# Patient Record
Sex: Female | Born: 1955 | Race: White | Hispanic: No | Marital: Married | Smoking: Never smoker
Health system: Southern US, Community
[De-identification: ages and names within clinical notes are randomized; demographics above are authoritative.]

## PROBLEM LIST (undated history)

## (undated) HISTORY — PX: OTHER SURGICAL HISTORY: SHX169

## (undated) HISTORY — PX: COLONOSCOPY: SHX174

## (undated) HISTORY — PX: TONSILECTOMY, ADENOIDECTOMY, BILATERAL MYRINGOTOMY AND TUBES: SHX2538

---

## 2000-09-15 ENCOUNTER — Encounter: Admission: RE | Admit: 2000-09-15 | Discharge: 2000-09-15 | Payer: Self-pay | Admitting: Obstetrics and Gynecology

## 2000-09-15 ENCOUNTER — Encounter: Payer: Self-pay | Admitting: Obstetrics and Gynecology

## 2001-10-22 ENCOUNTER — Encounter: Payer: Self-pay | Admitting: Obstetrics and Gynecology

## 2001-10-22 ENCOUNTER — Encounter: Admission: RE | Admit: 2001-10-22 | Discharge: 2001-10-22 | Payer: Self-pay | Admitting: Obstetrics and Gynecology

## 2002-11-03 ENCOUNTER — Encounter: Admission: RE | Admit: 2002-11-03 | Discharge: 2002-11-03 | Payer: Self-pay | Admitting: Obstetrics and Gynecology

## 2002-11-03 ENCOUNTER — Encounter: Payer: Self-pay | Admitting: Obstetrics and Gynecology

## 2004-01-08 ENCOUNTER — Encounter: Admission: RE | Admit: 2004-01-08 | Discharge: 2004-01-08 | Payer: Self-pay | Admitting: Obstetrics and Gynecology

## 2005-01-15 ENCOUNTER — Encounter: Admission: RE | Admit: 2005-01-15 | Discharge: 2005-01-15 | Payer: Self-pay | Admitting: Obstetrics and Gynecology

## 2006-01-16 ENCOUNTER — Encounter: Admission: RE | Admit: 2006-01-16 | Discharge: 2006-01-16 | Payer: Self-pay | Admitting: Obstetrics and Gynecology

## 2006-09-10 ENCOUNTER — Ambulatory Visit: Payer: Self-pay | Admitting: Gastroenterology

## 2006-10-05 ENCOUNTER — Ambulatory Visit: Payer: Self-pay | Admitting: Internal Medicine

## 2007-01-18 ENCOUNTER — Encounter: Admission: RE | Admit: 2007-01-18 | Discharge: 2007-01-18 | Payer: Self-pay | Admitting: Obstetrics and Gynecology

## 2008-01-19 ENCOUNTER — Encounter: Admission: RE | Admit: 2008-01-19 | Discharge: 2008-01-19 | Payer: Self-pay | Admitting: Obstetrics and Gynecology

## 2009-01-19 ENCOUNTER — Encounter: Admission: RE | Admit: 2009-01-19 | Discharge: 2009-01-19 | Payer: Self-pay | Admitting: Obstetrics and Gynecology

## 2010-03-06 ENCOUNTER — Encounter: Admission: RE | Admit: 2010-03-06 | Discharge: 2010-03-06 | Payer: Self-pay | Admitting: Obstetrics and Gynecology

## 2011-01-27 ENCOUNTER — Other Ambulatory Visit: Payer: Self-pay | Admitting: Family Medicine

## 2011-01-27 DIAGNOSIS — Z1231 Encounter for screening mammogram for malignant neoplasm of breast: Secondary | ICD-10-CM

## 2011-03-10 ENCOUNTER — Ambulatory Visit
Admission: RE | Admit: 2011-03-10 | Discharge: 2011-03-10 | Disposition: A | Discharge status: Home / Self Care | Payer: BC Managed Care – PPO | Source: Ambulatory Visit | Attending: Family Medicine | Admitting: Family Medicine

## 2011-03-10 DIAGNOSIS — Z1231 Encounter for screening mammogram for malignant neoplasm of breast: Secondary | ICD-10-CM

## 2012-01-30 ENCOUNTER — Other Ambulatory Visit: Payer: Self-pay | Admitting: Family Medicine

## 2012-01-30 DIAGNOSIS — Z1231 Encounter for screening mammogram for malignant neoplasm of breast: Secondary | ICD-10-CM

## 2012-03-11 ENCOUNTER — Ambulatory Visit
Admission: RE | Admit: 2012-03-11 | Discharge: 2012-03-11 | Disposition: A | Discharge status: Home / Self Care | Payer: BC Managed Care – PPO | Source: Ambulatory Visit | Attending: Family Medicine | Admitting: Family Medicine

## 2012-03-11 DIAGNOSIS — Z1231 Encounter for screening mammogram for malignant neoplasm of breast: Secondary | ICD-10-CM

## 2013-02-14 ENCOUNTER — Other Ambulatory Visit: Payer: Self-pay

## 2013-02-14 DIAGNOSIS — Z1231 Encounter for screening mammogram for malignant neoplasm of breast: Secondary | ICD-10-CM

## 2013-03-22 ENCOUNTER — Ambulatory Visit
Admission: RE | Admit: 2013-03-22 | Discharge: 2013-03-22 | Disposition: A | Discharge status: Home / Self Care | Payer: BC Managed Care – PPO | Source: Ambulatory Visit

## 2013-03-22 DIAGNOSIS — Z1231 Encounter for screening mammogram for malignant neoplasm of breast: Secondary | ICD-10-CM

## 2013-08-11 ENCOUNTER — Ambulatory Visit (INDEPENDENT_AMBULATORY_CARE_PROVIDER_SITE_OTHER): Payer: BC Managed Care – PPO | Admitting: *Deleted

## 2013-08-11 DIAGNOSIS — Z23 Encounter for immunization: Secondary | ICD-10-CM

## 2013-08-11 NOTE — Patient Instructions (Signed)

## 2013-08-26 ENCOUNTER — Ambulatory Visit (INDEPENDENT_AMBULATORY_CARE_PROVIDER_SITE_OTHER): Payer: BC Managed Care – PPO | Admitting: *Deleted

## 2013-08-26 DIAGNOSIS — Z23 Encounter for immunization: Secondary | ICD-10-CM

## 2013-09-15 ENCOUNTER — Encounter: Payer: Self-pay | Admitting: *Deleted

## 2013-09-29 ENCOUNTER — Ambulatory Visit (INDEPENDENT_AMBULATORY_CARE_PROVIDER_SITE_OTHER): Payer: BC Managed Care – PPO | Admitting: General Practice

## 2013-09-29 ENCOUNTER — Encounter: Payer: Self-pay | Admitting: General Practice

## 2013-09-29 VITALS — BP 157/83 | HR 81 | Temp 97.8°F | Ht 62.5 in | Wt 149.2 lb

## 2013-09-29 DIAGNOSIS — Z23 Encounter for immunization: Secondary | ICD-10-CM

## 2013-09-29 DIAGNOSIS — Z0000 Encounter for general adult medical examination without abnormal findings: Secondary | ICD-10-CM

## 2013-09-29 NOTE — Patient Instructions (Signed)

## 2013-09-29 NOTE — Progress Notes (Signed)
   Subjective:    Patient ID: Julie Benton, female    DOB: July 16, 1955, 58 y.o.   MRN: [id number]  HPI Patient presents today for annual physical. Denies any chronic health conditions. Taking OTC vitamins. Annual gyn exam performed by provider in [place]. Current also with mammograms. Family history of diabetes and hypertension. Reports eating healthy and regular exercise.    Review of Systems  Constitutional: Negative for fever and chills.  Respiratory: Negative for chest tightness and shortness of breath.   Cardiovascular: Negative for chest pain and palpitations.  Gastrointestinal: Negative for nausea, vomiting, abdominal pain, diarrhea, constipation and blood in stool.  Neurological: Negative for dizziness, weakness and headaches.       Objective:   Physical Exam  Constitutional: She is oriented to person, place, and time. She appears well-developed and well-nourished.  HENT:  Head: Normocephalic and atraumatic.  Right Ear: External ear normal.  Left Ear: External ear normal.  Mouth/Throat: Oropharynx is clear and moist.  Eyes: EOM are normal. Pupils are equal, round, and reactive to light.  Neck: Normal range of motion. Neck supple. No thyromegaly present.  Cardiovascular: Normal rate, regular rhythm and normal heart sounds.   Pulmonary/Chest: Effort normal and breath sounds normal. No respiratory distress. She exhibits no tenderness.  Abdominal: Soft. Bowel sounds are normal. She exhibits no distension. There is no tenderness.  Lymphadenopathy:    She has no cervical adenopathy.  Neurological: She is alert and oriented to person, place, and time.  Skin: Skin is warm and dry.  Psychiatric: She has a normal mood and affect.          Assessment & Plan:  1. Need for pneumococcal vaccine  - Pneumococcal conjugate vaccine 13-valent  2. Annual physical exam -discussed benefits of healthy eating and regular exercise -RTO prn -Patient verbalized understanding Coralie KeensMae E.  Obera Stauch, FNP-C

## 2013-10-04 ENCOUNTER — Ambulatory Visit: Payer: BC Managed Care – PPO | Admitting: General Practice

## 2013-10-11 ENCOUNTER — Ambulatory Visit: Payer: BC Managed Care – PPO | Admitting: General Practice

## 2013-12-21 ENCOUNTER — Ambulatory Visit (INDEPENDENT_AMBULATORY_CARE_PROVIDER_SITE_OTHER): Payer: BC Managed Care – PPO | Admitting: Family Medicine

## 2013-12-21 VITALS — BP 135/85 | HR 92 | Temp 98.5°F | Ht 62.5 in

## 2013-12-21 DIAGNOSIS — T148XXA Other injury of unspecified body region, initial encounter: Secondary | ICD-10-CM

## 2013-12-21 DIAGNOSIS — IMO0002 Reserved for concepts with insufficient information to code with codable children: Secondary | ICD-10-CM

## 2013-12-21 NOTE — Progress Notes (Signed)
   Subjective:    Patient ID: Julie Benton, female    DOB: 05-03-1956, 58 y.o.   MRN: [id number]  HPI Patient cut her right index finger by accident this am and she is up to date with TD.   Review of Systems C/o laceration right index finger No chest pain, SOB, HA, dizziness, vision change, N/V, diarrhea, constipation, dysuria, urinary urgency or frequency, myalgias, arthralgias or rash.     Objective:   Physical Exam  2 cm laceration right index finger.  Laceration cleaned with betadine and irrigated with sterile NS.  Marcaine used for local anesthetic and then once adequate anesthesia four # 5.0 nylon sutures are Used are placed to approximate incision and then the index finger is dressed.        Assessment & Plan:  Laceration right index finger - Discussed signs and symptoms of infection.  Discussed taking motrin and tylenol otc prn for pain.  Laceration approximated with 5.0 nylon sutures.  Patient given instructions to follow up in one week for sutrue removal.  Deatra CanterWilliam J Marcene Laskowski FNP

## 2013-12-29 ENCOUNTER — Encounter: Payer: Self-pay | Admitting: Family Medicine

## 2013-12-29 ENCOUNTER — Ambulatory Visit (INDEPENDENT_AMBULATORY_CARE_PROVIDER_SITE_OTHER): Payer: BC Managed Care – PPO | Admitting: Family Medicine

## 2013-12-29 VITALS — BP 128/77 | HR 87 | Temp 97.1°F | Ht 62.5 in | Wt 150.4 lb

## 2013-12-29 DIAGNOSIS — Z4802 Encounter for removal of sutures: Secondary | ICD-10-CM

## 2013-12-29 NOTE — Progress Notes (Signed)
   Subjective:    Patient ID: Julie ParodySherri S Mira, female    DOB: 10-07-1955, 58 y.o.   MRN: [id number]  HPI Suture removal right index finger.  Denies infection.   Review of Systems    No chest pain, SOB, HA, dizziness, vision change, N/V, diarrhea, constipation, dysuria, urinary urgency or frequency, myalgias, arthralgias or rash.  Objective:   Physical Exam   4 nylon sutures removed right index finger and edges of Lac well approx     Assessment & Plan:  Suture removal - Follow up prn

## 2014-02-15 ENCOUNTER — Other Ambulatory Visit: Payer: Self-pay

## 2014-02-15 DIAGNOSIS — Z1231 Encounter for screening mammogram for malignant neoplasm of breast: Secondary | ICD-10-CM

## 2014-02-28 ENCOUNTER — Ambulatory Visit (INDEPENDENT_AMBULATORY_CARE_PROVIDER_SITE_OTHER): Payer: BC Managed Care – PPO | Admitting: Family

## 2014-02-28 ENCOUNTER — Ambulatory Visit (INDEPENDENT_AMBULATORY_CARE_PROVIDER_SITE_OTHER): Payer: BC Managed Care – PPO

## 2014-02-28 VITALS — BP 126/81 | HR 82 | Temp 97.3°F

## 2014-02-28 DIAGNOSIS — S99929A Unspecified injury of unspecified foot, initial encounter: Secondary | ICD-10-CM

## 2014-02-28 DIAGNOSIS — S8990XA Unspecified injury of unspecified lower leg, initial encounter: Secondary | ICD-10-CM

## 2014-02-28 DIAGNOSIS — S92901A Unspecified fracture of right foot, initial encounter for closed fracture: Secondary | ICD-10-CM

## 2014-02-28 DIAGNOSIS — S92909A Unspecified fracture of unspecified foot, initial encounter for closed fracture: Secondary | ICD-10-CM

## 2014-02-28 DIAGNOSIS — S99922A Unspecified injury of left foot, initial encounter: Secondary | ICD-10-CM

## 2014-02-28 DIAGNOSIS — S99919A Unspecified injury of unspecified ankle, initial encounter: Secondary | ICD-10-CM

## 2014-02-28 DIAGNOSIS — S99921A Unspecified injury of right foot, initial encounter: Secondary | ICD-10-CM

## 2014-02-28 NOTE — Progress Notes (Signed)
   Subjective:    Patient ID: Julie Benton, female    DOB: Nov 02, 1955, 58 y.o.   MRN: [id number]  Foot Injury  The incident occurred 2 days ago. The incident occurred in the yard. The injury mechanism was a twisting injury. The pain is present in the right foot. The quality of the pain is described as aching and shooting. The pain is at a severity of 1/10. The pain is mild. The pain has been intermittent since onset. Associated symptoms include an inability to bear weight and a loss of motion. Pertinent negatives include no muscle weakness, numbness or tingling. She reports no foreign bodies present. The symptoms are aggravated by movement and weight bearing. She has tried ice and NSAIDs for the symptoms. The treatment provided mild relief.      Review of Systems  Constitutional: Negative.   HENT: Negative.   Eyes: Negative.   Respiratory: Negative.  Negative for shortness of breath.   Cardiovascular: Negative.  Negative for palpitations.  Gastrointestinal: Negative.   Endocrine: Negative.   Genitourinary: Negative.   Musculoskeletal: Negative.   Neurological: Negative.  Negative for tingling, numbness and headaches.  Hematological: Negative.   Psychiatric/Behavioral: Negative.   All other systems reviewed and are negative.      Objective:   Physical Exam  Vitals reviewed. Constitutional: She is oriented to person, place, and time. She appears well-developed and well-nourished. No distress.  Eyes: Pupils are equal, round, and reactive to light.  Neck: Normal range of motion. Neck supple. No thyromegaly present.  Cardiovascular: Normal rate, regular rhythm, normal heart sounds and intact distal pulses.   No murmur heard. Pulmonary/Chest: Effort normal and breath sounds normal. No respiratory distress. She has no wheezes.  Abdominal: Soft. Bowel sounds are normal. She exhibits no distension. There is no tenderness.  Musculoskeletal: Normal range of motion. She exhibits edema and  tenderness.  Right foot swollen and tender to touch with ecchymosis   Neurological: She is alert and oriented to person, place, and time. She has normal reflexes. No cranial nerve deficit.  Skin: Skin is warm and dry.  Psychiatric: She has a normal mood and affect. Her behavior is normal. Judgment and thought content normal.     BP 126/81  Pulse 82  Temp(Src) 97.3 F (36.3 C) (Oral)      Assessment & Plan:  1. Foot injury, right, initial encounter - DG Foot Complete Right; Future - Ambulatory referral to Orthopedic Surgery  2. Foot fracture, right, closed, initial encounter -Rest -Ice -Orthopedic referral  Jannifer Rodney, FNP

## 2014-02-28 NOTE — Patient Instructions (Signed)
Metatarsal Stress Fracture When too much stress is put on the foot, as in running and jumping sports, the center shaft of the bones of the forefoot is very susceptible to stress fractures (break in bone). This is because of repetitive stress on the bone. This injury is more common if osteoporosis is present or if inadequate running shoes are used. Rapid increase in running distances are often the cause. Running distances should be gradually increased to avoid this problem. Shoes should be used which adequately cushion the foot. Shoes should absorb the shocks of the activity.  DIAGNOSIS  Usually the diagnosis is made by history. The foot progressively becomes sorer with activities. X-rays may be negative (show no break) within the first 2 to 3 weeks of the beginning of pain. A later X-ray may show signs of healing bone (callus formation). A bone scan or MRI will usually make the diagnosis earlier. TREATMENT AND HOME CARE INSTRUCTIONS  Treatment may or may not include a cast, removable fracture boot, or walking shoe. Casts are used for short periods of time to prevent muscle atrophy (muscle wasting).  Activities should be stopped until further advised by your caregiver.  Wear shoes with adequate shock absorbing abilities.  Alternative exercise may be undertaken while waiting for healing. These may include bicycling and swimming, or as your caregiver suggests. If you do not have a cast or splint:  You may walk on your injured foot as tolerated or advised.  Do not put any weight on your injured foot for as long as directed by your caregiver. Slowly increase the amount of time you walk on the foot as the pain allows or as advised.  Use crutches until you can bear weight without pain. A gradual increase in weight bearing may help.  Apply ice to the injury for 15-20 minutes each hour while awake for the first 2 days. Put the ice in a plastic bag and place a towel between the bag of ice and your  skin.  Only take over-the-counter or prescription medicines for pain, discomfort, or fever as directed by your caregiver. SEEK IMMEDIATE MEDICAL CARE IF:   Pain is becoming worse rather than better, or if pain is uncontrolled with medications.  You have increased swelling or redness in the foot. MAKE SURE YOU:   Understand these instructions.  Will watch your condition.  Will get help right away if you are not doing well or get worse. Document Released: 05/23/2000 Document Revised: 08/18/2011 Document Reviewed: 03/21/2008 Exit[employer] Patient Information 2015 [employer]. This information is not intended to replace advice given to you by your health care provider. Make sure you discuss any questions you have with your health care provider.  

## 2014-03-24 ENCOUNTER — Other Ambulatory Visit: Payer: Self-pay

## 2014-03-28 ENCOUNTER — Ambulatory Visit
Admission: RE | Admit: 2014-03-28 | Discharge: 2014-03-28 | Disposition: A | Discharge status: Home / Self Care | Payer: BC Managed Care – PPO | Source: Ambulatory Visit

## 2014-03-28 DIAGNOSIS — Z1231 Encounter for screening mammogram for malignant neoplasm of breast: Secondary | ICD-10-CM

## 2015-02-23 ENCOUNTER — Other Ambulatory Visit: Payer: Self-pay

## 2015-02-23 DIAGNOSIS — Z1231 Encounter for screening mammogram for malignant neoplasm of breast: Secondary | ICD-10-CM

## 2015-04-18 ENCOUNTER — Ambulatory Visit
Admission: RE | Admit: 2015-04-18 | Discharge: 2015-04-18 | Disposition: A | Discharge status: Home / Self Care | Payer: BC Managed Care – PPO | Source: Ambulatory Visit

## 2015-04-18 DIAGNOSIS — Z1231 Encounter for screening mammogram for malignant neoplasm of breast: Secondary | ICD-10-CM

## 2015-06-06 ENCOUNTER — Ambulatory Visit (INDEPENDENT_AMBULATORY_CARE_PROVIDER_SITE_OTHER): Payer: BC Managed Care – PPO | Admitting: Physician Assistant

## 2015-06-06 ENCOUNTER — Encounter: Payer: Self-pay | Admitting: Physician Assistant

## 2015-06-06 VITALS — BP 142/84 | HR 95 | Temp 97.7°F | Ht 62.5 in | Wt 147.4 lb

## 2015-06-06 DIAGNOSIS — J029 Acute pharyngitis, unspecified: Secondary | ICD-10-CM

## 2015-06-06 DIAGNOSIS — J0100 Acute maxillary sinusitis, unspecified: Secondary | ICD-10-CM | POA: Diagnosis not present

## 2015-06-06 LAB — POCT RAPID STREP A (OFFICE): Rapid Strep A Screen: NEGATIVE

## 2015-06-06 MED ORDER — SULFAMETHOXAZOLE-TRIMETHOPRIM 800-160 MG PO TABS: 1.0000 | ORAL_TABLET | Freq: Two times a day (BID) | ORAL | Status: DC

## 2015-06-06 NOTE — Patient Instructions (Signed)

## 2015-06-06 NOTE — Progress Notes (Signed)
Subjective:     Patient ID: Julie Benton, female   DOB: 1955-06-15, 59 y.o.   MRN: [id number]  HPI Pt with facial pain and sinus congestion for 3-4 days Using Allegra D and Mucinex but sx continue  Review of Systems  Constitutional: Positive for fever and fatigue. Negative for chills, activity change and appetite change.  HENT: Positive for congestion, dental problem, postnasal drip, sinus pressure and sore throat. Negative for ear pain and rhinorrhea.   Respiratory: Positive for cough. Negative for chest tightness, shortness of breath and wheezing.   Cardiovascular: Negative.        Objective:   Physical Exam  Constitutional: She appears well-developed and well-nourished.  HENT:  Right Ear: External ear normal.  Left Ear: External ear normal.  Nose: Nose normal.  Mouth/Throat: Oropharynx is clear and moist. No oropharyngeal exudate.  + maxillary sinus TTP  Neck: Neck supple.  Cardiovascular: Normal rate, regular rhythm and normal heart sounds.   Pulmonary/Chest: Effort normal and breath sounds normal. No respiratory distress. She has no wheezes. She has no rales. She exhibits no tenderness.  Lymphadenopathy:    She has no cervical adenopathy.  Nursing note and vitals reviewed.  RST- neg    Assessment:     Sinusitis    Plan:     Bactrim DS x 10 d due to Keflex allergy Continue the Allergra D Fluids Rest F/U prn

## 2015-07-02 ENCOUNTER — Ambulatory Visit (INDEPENDENT_AMBULATORY_CARE_PROVIDER_SITE_OTHER): Payer: BC Managed Care – PPO | Admitting: Pediatrics

## 2015-07-02 ENCOUNTER — Encounter: Payer: Self-pay | Admitting: Pediatrics

## 2015-07-02 VITALS — BP 128/86 | HR 90 | Temp 97.4°F | Ht 62.5 in | Wt 146.4 lb

## 2015-07-02 DIAGNOSIS — J329 Chronic sinusitis, unspecified: Secondary | ICD-10-CM | POA: Diagnosis not present

## 2015-07-02 MED ORDER — DOXYCYCLINE HYCLATE 100 MG PO TABS: 100.0000 mg | ORAL_TABLET | Freq: Two times a day (BID) | ORAL | Status: DC

## 2015-07-02 NOTE — Progress Notes (Signed)
Subjective:    Patient ID: Julie Benton, female    DOB: 10-07-1955, 60 y.o.   MRN: [id number]  CC: Headache and Dental Pain   HPI: Julie Benton is a 60 y.o. female presenting for Headache and Dental Pain  Took bactrim 4 weeks ago for acute sinusitis, helped some within 3 days Has been using saline solution daily at least twice a day Taking mucinex and allegra D Pain in L side of face that is severe No fevers Appetite down Dental pain, not able to chew on L side Went to dentist when had same pain with infection 4 weeks ago, was told her teeth are fine   Depression screen [facility] 2/9 07/02/2015 06/06/2015  Decreased Interest 0 0  Down, Depressed, Hopeless 0 0  PHQ - 2 Score 0 0     Relevant past medical, surgical, family and social history reviewed and updated as indicated. Interim medical history since our last visit reviewed. Allergies and medications reviewed and updated.    ROS: Per HPI unless specifically indicated above  History  Smoking status  . Never Smoker   Smokeless tobacco  . Not on file    Past Medical History There are no active problems to display for this patient.   Current Outpatient Prescriptions  Medication Sig Dispense Refill  . Biotin 5000 MCG TABS Take by mouth.    . Calcium-Phosphorus-Vitamin D (CITRACAL +D3 PO) Take by mouth.    . Chromium-Cinnamon (CINNAMON PLUS CHROMIUM) [phone] MCG-MG CAPS Take by mouth.    Marland Kitchen CINNAMON PO Take by mouth.    . diphenhydrAMINE (BENADRYL) 25 mg capsule Take 25 mg by mouth every 6 (six) hours as needed.    . Magnesium 250 MG TABS Take by mouth.    . Milk Thistle 1000 MG CAPS Take by mouth.    . Omega-3 Fatty Acids (FISH OIL TRIPLE STRENGTH) 1400 MG CAPS Take by mouth.    Gerarda Fraction Root 100 MG CAPS Take by mouth.    . vitamin E 400 UNIT capsule Take 400 Units by mouth daily.    Marland Kitchen doxycycline (VIBRA-TABS) 100 MG tablet Take 1 tablet (100 mg total) by mouth 2 (two) times daily. 20 tablet 0   No  current facility-administered medications for this visit.       Objective:    BP 128/86 mmHg  Pulse 90  Temp(Src) 97.4 F (36.3 C) (Oral)  Ht 5' 2.5" (1.588 m)  Wt 146 lb 6.4 oz (66.407 kg)  BMI 26.33 kg/m2  Wt Readings from Last 3 Encounters:  07/02/15 146 lb 6.4 oz (66.407 kg)  06/06/15 147 lb 6.4 oz (66.86 kg)  12/29/13 150 lb 6.4 oz (68.221 kg)     Gen: NAD, alert, cooperative with exam, NCAT EYES: EOMI, no scleral injection or icterus ENT:  TMs pearly gray R side, obstructed with cerumen on L side, OP without erythema, very tender over L max sinus, slightly tender over L side frontal sinus LYMPH: no cervical LAD CV: NRRR, normal S1/S2, no murmur, distal pulses 2+ b/l Resp: CTABL, no wheezes, normal WOB Abd: soft, NTND Ext: No edema, warm Neuro: Alert and oriented MSK: normal muscle bulk     Assessment & Plan:    Adella was seen today for headache and dental pain.  Diagnoses and all orders for this visit:  Recurrent sinusitis -     doxycycline (VIBRA-TABS) 100 MG tablet; Take 1 tablet (100 mg total) by mouth 2 (two) times daily.  Follow up plan: prn  Rex Kras, MD [facility] 07/02/2015, 9:05 AM

## 2015-08-28 ENCOUNTER — Encounter: Payer: Self-pay | Admitting: Nurse Practitioner

## 2015-08-28 ENCOUNTER — Ambulatory Visit (INDEPENDENT_AMBULATORY_CARE_PROVIDER_SITE_OTHER): Payer: BC Managed Care – PPO | Admitting: Nurse Practitioner

## 2015-08-28 ENCOUNTER — Ambulatory Visit (INDEPENDENT_AMBULATORY_CARE_PROVIDER_SITE_OTHER): Payer: BC Managed Care – PPO

## 2015-08-28 ENCOUNTER — Telehealth: Payer: Self-pay | Admitting: Nurse Practitioner

## 2015-08-28 VITALS — BP 138/86 | HR 87 | Temp 98.3°F | Ht 62.0 in | Wt 146.0 lb

## 2015-08-28 DIAGNOSIS — T1490XA Injury, unspecified, initial encounter: Secondary | ICD-10-CM

## 2015-08-28 DIAGNOSIS — S99912A Unspecified injury of left ankle, initial encounter: Secondary | ICD-10-CM

## 2015-08-28 DIAGNOSIS — M25572 Pain in left ankle and joints of left foot: Secondary | ICD-10-CM

## 2015-08-28 DIAGNOSIS — S8262XA Displaced fracture of lateral malleolus of left fibula, initial encounter for closed fracture: Secondary | ICD-10-CM

## 2015-08-28 NOTE — Patient Instructions (Signed)
Ankle Fracture A fracture is a break in a bone. The ankle joint is made up of three bones. These include the lower (distal)sections of your lower leg bones, called the tibia and fibula, along with a bone in your foot, called the talus. Depending on how bad the break is and if more than one ankle joint bone is broken, a cast or splint is used to protect and keep your injured bone from moving while it heals. Sometimes, surgery is required to help the fracture heal properly.  There are two general types of fractures:  Stable fracture. This includes a single fracture line through one bone, with no injury to ankle ligaments. A fracture of the talus that does not have any displacement (movement of the bone on either side of the fracture line) is also stable.  Unstable fracture. This includes more than one fracture line through one or more bones in the ankle joint. It also includes fractures that have displacement of the bone on either side of the fracture line. CAUSES  A direct blow to the ankle.   Quickly and severely twisting your ankle.  Trauma, such as a car accident or falling from a significant height. RISK FACTORS You may be at a higher risk of ankle fracture if:  You have certain medical conditions.  You are involved in high-impact sports.  You are involved in a high-impact car accident. SIGNS AND SYMPTOMS   Tender and swollen ankle.  Bruising around the injured ankle.  Pain on movement of the ankle.  Difficulty walking or putting weight on the ankle.  A cold foot below the site of the ankle injury. This can occur if the blood vessels passing through your injured ankle were also damaged.  Numbness in the foot below the site of the ankle injury. DIAGNOSIS  An ankle fracture is usually diagnosed with a physical exam and X-rays. A CT scan may also be required for complex fractures. TREATMENT  Stable fractures are treated with a cast or splint and using crutches to avoid putting  weight on your injured ankle. This is followed by an ankle strengthening program. Some patients require a special type of cast, depending on other medical problems they may have. Unstable fractures require surgery to ensure the bones heal properly. Your health care provider will tell you what type of fracture you have and the best treatment for your condition. HOME CARE INSTRUCTIONS   Review correct crutch use with your health care provider and use your crutches as directed. Safe use of crutches is extremely important. Misuse of crutches can cause you to fall or cause injury to nerves in your hands or armpits.  Do not put weight or pressure on the injured ankle until directed by your health care provider.  To lessen the swelling, keep the injured leg elevated while sitting or lying down.  Apply ice to the injured area:  Put ice in a plastic bag.  Place a towel between your cast and the bag.  Leave the ice on for 20 minutes, 2-3 times a day.  If you have a plaster or fiberglass cast:  Do not try to scratch the skin under the cast with any objects. This can increase your risk of skin infection.  Check the skin around the cast every day. You may put lotion on any red or sore areas.  Keep your cast dry and clean.  If you have a plaster splint:  Wear the splint as directed.  You may loosen the elastic   around the splint if your toes become numb, tingle, or turn cold or blue.  Do not put pressure on any part of your cast or splint; it may break. Rest your cast only on a pillow the first 24 hours until it is fully hardened.  Your cast or splint can be protected during bathing with a plastic bag sealed to your skin with medical tape. Do not lower the cast or splint into water.  Take medicines as directed by your health care provider. Only take over-the-counter or prescription medicines for pain, discomfort, or fever as directed by your health care provider.  Do not drive a vehicle until  your health care provider specifically tells you it is safe to do so.  If your health care provider has given you a follow-up appointment, it is very important to keep that appointment. Not keeping the appointment could result in a chronic or permanent injury, pain, and disability. If you have any problem keeping the appointment, call the facility for assistance. SEEK MEDICAL CARE IF: You develop increased swelling or discomfort. SEEK IMMEDIATE MEDICAL CARE IF:   Your cast gets damaged or breaks.  You have continued severe pain.  You develop new pain or swelling after the cast was put on.  Your skin or toenails below the injury turn blue or gray.  Your skin or toenails below the injury feel cold, numb, or have loss of sensitivity to touch.  There is a bad smell or pus draining from under the cast. MAKE SURE YOU:   Understand these instructions.  Will watch your condition.  Will get help right away if you are not doing well or get worse.   This information is not intended to replace advice given to you by your health care provider. Make sure you discuss any questions you have with your health care provider.   Document Released: 05/23/2000 Document Revised: 05/31/2013 Document Reviewed: 12/23/2012 Elsevier Interactive Patient Education 2016 [employer]  

## 2015-08-28 NOTE — Progress Notes (Signed)
   Subjective:    Patient ID: Julie Benton, female    DOB: 02/25/1956, 60 y.o.   MRN: [id number]  HPI Patient in c/o left ankle pain- she was walking this morning and got her foot on something and injured her ankle- swollen and painful to walk on. Sitting pain is 5/10 walking 10/10.    Review of Systems  Constitutional: Negative.   HENT: Negative.   Respiratory: Negative.   Cardiovascular: Negative.   Genitourinary: Negative.   Neurological: Negative.   Psychiatric/Behavioral: Negative.   All other systems reviewed and are negative.      Objective:   Physical Exam  Constitutional: She is oriented to person, place, and time. She appears well-developed and well-nourished.  Cardiovascular: Normal rate, regular rhythm and normal heart sounds.   Pulmonary/Chest: Effort normal and breath sounds normal.  Neurological: She is alert and oriented to person, place, and time.  Skin: Skin is warm.  Psychiatric: She has a normal mood and affect. Her behavior is normal. Judgment and thought content normal.   BP 138/86 mmHg  Pulse 87  Temp(Src) 98.3 F (36.8 C) (Oral)  Ht 5\' 2"  (1.575 m)  Wt 146 lb (66.225 kg)  BMI 26.70 kg/m2  Left ankle- avulsion fracture distal lateral malleolus-Preliminary reading by Paulene FloorMary Haneen Bernales, FNP  [facility]       Assessment & Plan:   1. Injury   2. Ankle fracture, lateral malleolus, closed, left, initial encounter    rx written for short cam walker Elevate when sitting RTO prn  Mary-Margaret Daphine DeutscherMartin, FNP

## 2015-08-30 ENCOUNTER — Telehealth: Payer: Self-pay | Admitting: Nurse Practitioner

## 2015-08-30 NOTE — Telephone Encounter (Signed)
Advised pt there is no follow up appt but if she doesn't get better or the ankle gets worse to CB for appt to be re-evaluated. Pt voiced understanding.

## 2015-09-03 NOTE — Telephone Encounter (Signed)
Brace hurts too bad, [facility] adjusted.

## 2015-11-23 ENCOUNTER — Ambulatory Visit (INDEPENDENT_AMBULATORY_CARE_PROVIDER_SITE_OTHER): Payer: BC Managed Care – PPO | Admitting: Family Medicine

## 2015-11-23 ENCOUNTER — Encounter: Payer: Self-pay | Admitting: Family Medicine

## 2015-11-23 VITALS — BP 136/84 | HR 79 | Temp 97.8°F | Ht 63.0 in | Wt 144.0 lb

## 2015-11-23 DIAGNOSIS — Z23 Encounter for immunization: Secondary | ICD-10-CM | POA: Diagnosis not present

## 2015-11-23 DIAGNOSIS — H6122 Impacted cerumen, left ear: Secondary | ICD-10-CM | POA: Diagnosis not present

## 2015-11-23 DIAGNOSIS — H6092 Unspecified otitis externa, left ear: Secondary | ICD-10-CM

## 2015-11-23 MED ORDER — CIPROFLOXACIN-DEXAMETHASONE 0.3-0.1 % OT SUSP: 4.0000 [drp] | Freq: Two times a day (BID) | OTIC | Status: DC

## 2015-11-23 NOTE — Patient Instructions (Signed)
Pneumococcal Polysaccharide Vaccine: What You Need to Know  1. Why get vaccinated?  Vaccination can protect older adults (and some children and younger adults) from pneumococcal disease.  Pneumococcal disease is caused by bacteria that can spread from person to person through close contact. It can cause ear infections, and it can also lead to more serious infections of the:   · Lungs (pneumonia),  · Blood (bacteremia), and  · Covering of the brain and spinal cord (meningitis). Meningitis can cause deafness and brain damage, and it can be fatal.  Anyone can get pneumococcal disease, but children under 2 years of age, people with certain medical conditions, adults over 65 years of age, and cigarette smokers are at the highest risk.  About 18,000 older adults die each year from pneumococcal disease in the [place].  Treatment of pneumococcal infections with penicillin and other drugs used to be more effective. But some strains of the disease have become resistant to these drugs. This makes prevention of the disease, through vaccination, even more important.  2. Pneumococcal polysaccharide vaccine (PPSV23)  Pneumococcal polysaccharide vaccine (PPSV23) protects against 23 types of pneumococcal bacteria. It will not prevent all pneumococcal disease.  PPSV23 is recommended for:  · All adults 65 years of age and older,  · Anyone 2 through 60 years of age with certain long-term health problems,  · Anyone 2 through 60 years of age with a weakened immune system,  · Adults 19 through 60 years of age who smoke cigarettes or have asthma.  Most people need only one dose of PPSV. A second dose is recommended for certain high-risk groups. People 65 and older should get a dose even if they have gotten one or more doses of the vaccine before they turned 65.  Your healthcare provider can give you more information about these recommendations.  Most healthy adults develop protection within 2 to 3 weeks of getting the shot.  3. Some  people should not get this vaccine  · Anyone who has had a life-threatening allergic reaction to PPSV should not get another dose.  · Anyone who has a severe allergy to any component of PPSV should not receive it. Tell your provider if you have any severe allergies.  · Anyone who is moderately or severely ill when the shot is scheduled may be asked to wait until they recover before getting the vaccine. Someone with a mild illness can usually be vaccinated.  · Children less than 2 years of age should not receive this vaccine.  · There is no evidence that PPSV is harmful to either a pregnant woman or to her fetus. However, as a precaution, women who need the vaccine should be vaccinated before becoming pregnant, if possible.  4. Risks of a vaccine reaction  With any medicine, including vaccines, there is a chance of side effects. These are usually mild and go away on their own, but serious reactions are also possible.  About half of people who get PPSV have mild side effects, such as redness or pain where the shot is given, which go away within about two days.  Less than 1 out of 100 people develop a fever, muscle aches, or more severe local reactions.  Problems that could happen after any vaccine:  · People sometimes faint after a medical procedure, including vaccination. Sitting or lying down for about 15 minutes can help prevent fainting, and injuries caused by a fall. Tell your doctor if you feel dizzy, or have vision changes or   ringing in the ears.  · Some people get severe pain in the shoulder and have difficulty moving the arm where a shot was given. This happens very rarely.  · Any medication can cause a severe allergic reaction. Such reactions from a vaccine are very rare, estimated at about 1 in a million doses, and would happen within a few minutes to a few hours after the vaccination.  As with any medicine, there is a very remote chance of a vaccine causing a serious injury or death.  The safety of  vaccines is always being monitored. For more information, visit: www.cdc.gov/vaccinesafety/  5. What if there is a serious reaction?  What should I look for?  Look for anything that concerns you, such as signs of a severe allergic reaction, very high fever, or unusual behavior.   Signs of a severe allergic reaction can include hives, swelling of the face and throat, difficulty breathing, a fast heartbeat, dizziness, and weakness. These would usually start a few minutes to a few hours after the vaccination.  What should I do?  If you think it is a severe allergic reaction or other emergency that can't wait, call 9-1-1 or get to the nearest hospital. Otherwise, call your doctor.  Afterward, the reaction should be reported to the Vaccine Adverse Event Reporting System (VAERS). Your doctor might file this report, or you can do it yourself through the VAERS web site at www.vaers.hhs.gov, or by calling 1-800-822-7967.   VAERS does not give medical advice.  6. How can I learn more?  · Ask your doctor. He or she can give you the vaccine package insert or suggest other sources of information.  · Call your local or state health department.  · Contact the Centers for Disease Control and Prevention (CDC):    Call 1-800-232-4636 (1-800-CDC-INFO) or    Visit CDC's website at www.cdc.gov/vaccines  CDC Pneumococcal Polysaccharide Vaccine VIS (09/30/13)     This information is not intended to replace advice given to you by your health care provider. Make sure you discuss any questions you have with your health care provider.     Document Released: 03/23/2006 Document Revised: 06/16/2014 Document Reviewed: 10/03/2013  Elsevier Interactive Patient Education ©2016 [employer]

## 2015-11-23 NOTE — Progress Notes (Signed)
BP 136/84 mmHg  Pulse 79  Temp(Src) 97.8 F (36.6 C)  Ht 5\' 3"  (1.6 m)  Wt 144 lb (65.318 kg)  BMI 25.51 kg/m2   Subjective:    Patient ID: Julie Benton, female    DOB: 08-29-1955, 60 y.o.   MRN: [id number]  HPI: Julie Benton is a 60 y.o. female presenting on 11/23/2015 for Ear Pain   HPI Left ear pain and pressure Patient has been having left ear pain and pressure since yesterday. She fell as shooting pain in that left ear started yesterday and then she is also been slightly muffled in her right ear as well. She's been trying the deep rocks drops but is also been using Q-tips as well. She denies any fevers or chills or shortness of breath. She denies any nasal congestion or postnasal drainage.  Relevant past medical, surgical, family and social history reviewed and updated as indicated. Interim medical history since our last visit reviewed. Allergies and medications reviewed and updated.  Review of Systems  Constitutional: Negative for fever and chills.  HENT: Positive for ear pain and hearing loss (Muffled). Negative for congestion and ear discharge.   Eyes: Negative for redness and visual disturbance.  Respiratory: Negative for cough, chest tightness and shortness of breath.   Cardiovascular: Negative for chest pain and leg swelling.  Genitourinary: Negative for dysuria and difficulty urinating.  Musculoskeletal: Negative for back pain and gait problem.  Skin: Negative for color change and rash.  Neurological: Negative for light-headedness and headaches.  Psychiatric/Behavioral: Negative for behavioral problems and agitation.  All other systems reviewed and are negative.   Per HPI unless specifically indicated above     Medication List       This list is accurate as of: 11/23/15  6:50 PM.  Always use your most recent med list.               Biotin 5000 MCG Tabs  Take by mouth.     CINNAMON PLUS CHROMIUM [phone] MCG-MG Caps  Generic drug:   Chromium-Cinnamon  Take by mouth.     CITRACAL +D3 PO  Take by mouth.     diphenhydrAMINE 25 mg capsule  Commonly known as:  BENADRYL  Take 25 mg by mouth every 6 (six) hours as needed.     FISH OIL TRIPLE STRENGTH 1400 MG Caps  Take by mouth.     Magnesium 250 MG Tabs  Take by mouth.     Milk Thistle 1000 MG Caps  Take by mouth.     Valerian Root 100 MG Caps  Take by mouth.     vitamin E 400 UNIT capsule  Take 400 Units by mouth daily.           Objective:    BP 136/84 mmHg  Pulse 79  Temp(Src) 97.8 F (36.6 C)  Ht 5\' 3"  (1.6 m)  Wt 144 lb (65.318 kg)  BMI 25.51 kg/m2  Wt Readings from Last 3 Encounters:  11/23/15 144 lb (65.318 kg)  08/28/15 146 lb (66.225 kg)  07/02/15 146 lb 6.4 oz (66.407 kg)    Physical Exam  Constitutional: She is oriented to person, place, and time. She appears well-developed and well-nourished. No distress.  HENT:  Nose: Nose normal.  Mouth/Throat: Oropharynx is clear and moist. No oropharyngeal exudate.  Patient has wax in her left ear that was unable to be lavaged. Mild erythema and canals well.  Patient also has wax in her right ear but  that was able to be lavaged and ear canal looks normal.  Eyes: Conjunctivae and EOM are normal. Pupils are equal, round, and reactive to light.  Cardiovascular: Normal rate, regular rhythm, normal heart sounds and intact distal pulses.   No murmur heard. Pulmonary/Chest: Effort normal and breath sounds normal. No respiratory distress. She has no wheezes.  Musculoskeletal: Normal range of motion. She exhibits no edema or tenderness.  Neurological: She is alert and oriented to person, place, and time. Coordination normal.  Skin: Skin is warm and dry. No rash noted. She is not diaphoretic.  Psychiatric: She has a normal mood and affect. Her behavior is normal.  Nursing note and vitals reviewed.      Assessment & Plan:   Problem List Items Addressed This Visit    None    Visit Diagnoses     Cerumen impaction, left    -  Primary    Nurse to lavage, will see if this gets her feeling better.    Relevant Medications    ciprofloxacin-dexamethasone (CIPRODEX) otic suspension    Otitis externa, left            Follow up plan: Return if symptoms worsen or fail to improve.  Counseling provided for all of the vaccine components Orders Placed This Encounter  Procedures  . Pneumococcal polysaccharide vaccine 23-valent greater than or equal to 2yo subcutaneous/IM    Arville Care, MD [facility] 11/23/2015, 6:50 PM

## 2015-11-26 ENCOUNTER — Telehealth: Payer: Self-pay | Admitting: Family Medicine

## 2015-11-26 DIAGNOSIS — H6122 Impacted cerumen, left ear: Secondary | ICD-10-CM

## 2015-11-26 MED ORDER — AZITHROMYCIN 250 MG PO TABS: ORAL_TABLET | ORAL | Status: DC

## 2015-11-26 NOTE — Telephone Encounter (Signed)
Have her keep doing the drops and send her prescription for azithromycin 250 mg take 2 on first day and then one daily for 4 more days. For a total of 6 pills. If this does not improve her that we may send her to ENT

## 2015-11-26 NOTE — Telephone Encounter (Signed)
Patient aware.

## 2015-11-26 NOTE — Telephone Encounter (Signed)
Please review and advise. Route to Pool A 

## 2015-11-28 ENCOUNTER — Telehealth: Payer: Self-pay | Admitting: Family

## 2015-11-28 MED ORDER — CIPROFLOXACIN-DEXAMETHASONE 0.3-0.1 % OT SUSP: 4.0000 [drp] | Freq: Two times a day (BID) | OTIC | Status: DC

## 2015-11-28 MED ORDER — NEOMYCIN-POLYMYXIN-HC 1 % OT SOLN: OTIC | Status: DC

## 2015-11-28 NOTE — Telephone Encounter (Signed)
Patient aware new Rx has been sent to pharmacy

## 2015-11-28 NOTE — Telephone Encounter (Signed)
Please call and Cortisporin otic and have her use 3-4 drops 4 times daily to the affected ear or ears with a cotton wick in place

## 2015-11-28 NOTE — Addendum Note (Signed)
Addended by: Tamera PuntWRAY, WENDY S on: 11/28/2015 10:59 AM   Modules accepted: Orders

## 2015-12-03 ENCOUNTER — Telehealth: Payer: Self-pay | Admitting: Family

## 2015-12-03 DIAGNOSIS — H6123 Impacted cerumen, bilateral: Secondary | ICD-10-CM

## 2015-12-03 NOTE — Telephone Encounter (Signed)
Referral ordered and pt is aware. 

## 2015-12-07 ENCOUNTER — Telehealth: Payer: Self-pay | Admitting: Family Medicine

## 2015-12-07 NOTE — Telephone Encounter (Signed)
Are you aware of this 

## 2015-12-07 NOTE — Telephone Encounter (Signed)
Try to get earlier her ENT appointment and if necessary I'll be glad to talk to the specialist

## 2015-12-07 NOTE — Telephone Encounter (Signed)
Has appt end of July - per moore needs soon!! Can you work on this Carlon ? -- (referral)

## 2015-12-10 NOTE — Telephone Encounter (Signed)
[facility] to x-ray 

## 2015-12-12 ENCOUNTER — Telehealth: Payer: Self-pay | Admitting: Family

## 2015-12-19 DIAGNOSIS — H903 Sensorineural hearing loss, bilateral: Secondary | ICD-10-CM | POA: Insufficient documentation

## 2015-12-19 DIAGNOSIS — H6123 Impacted cerumen, bilateral: Secondary | ICD-10-CM | POA: Insufficient documentation

## 2015-12-19 DIAGNOSIS — H9203 Otalgia, bilateral: Secondary | ICD-10-CM | POA: Insufficient documentation

## 2016-03-17 ENCOUNTER — Other Ambulatory Visit: Payer: Self-pay | Admitting: Family Medicine

## 2016-03-17 DIAGNOSIS — Z1231 Encounter for screening mammogram for malignant neoplasm of breast: Secondary | ICD-10-CM

## 2016-04-18 ENCOUNTER — Ambulatory Visit
Admission: RE | Admit: 2016-04-18 | Discharge: 2016-04-18 | Disposition: A | Discharge status: Home / Self Care | Payer: BC Managed Care – PPO | Source: Ambulatory Visit | Attending: Family Medicine | Admitting: Family Medicine

## 2016-04-18 DIAGNOSIS — Z1231 Encounter for screening mammogram for malignant neoplasm of breast: Secondary | ICD-10-CM

## 2016-11-04 ENCOUNTER — Encounter: Payer: Self-pay | Admitting: Internal Medicine

## 2016-12-16 ENCOUNTER — Encounter: Payer: Self-pay | Admitting: Internal Medicine

## 2017-02-17 ENCOUNTER — Encounter: Payer: BC Managed Care – PPO | Admitting: Internal Medicine

## 2017-03-04 ENCOUNTER — Other Ambulatory Visit: Payer: Self-pay | Admitting: Family Medicine

## 2017-03-04 DIAGNOSIS — Z1231 Encounter for screening mammogram for malignant neoplasm of breast: Secondary | ICD-10-CM

## 2017-03-25 ENCOUNTER — Other Ambulatory Visit: Payer: Self-pay | Admitting: Family Medicine

## 2017-03-25 DIAGNOSIS — H6122 Impacted cerumen, left ear: Secondary | ICD-10-CM

## 2017-03-26 ENCOUNTER — Other Ambulatory Visit: Payer: Self-pay | Admitting: Family Medicine

## 2017-03-27 ENCOUNTER — Other Ambulatory Visit: Payer: Self-pay | Admitting: Family Medicine

## 2017-03-27 DIAGNOSIS — H6122 Impacted cerumen, left ear: Secondary | ICD-10-CM

## 2017-05-11 ENCOUNTER — Ambulatory Visit
Admission: RE | Admit: 2017-05-11 | Discharge: 2017-05-11 | Disposition: A | Discharge status: Home / Self Care | Payer: BC Managed Care – PPO | Source: Ambulatory Visit | Attending: Family Medicine | Admitting: Family Medicine

## 2017-05-11 DIAGNOSIS — Z1231 Encounter for screening mammogram for malignant neoplasm of breast: Secondary | ICD-10-CM

## 2018-04-01 ENCOUNTER — Other Ambulatory Visit: Payer: Self-pay | Admitting: Family Medicine

## 2018-04-01 DIAGNOSIS — Z1231 Encounter for screening mammogram for malignant neoplasm of breast: Secondary | ICD-10-CM

## 2018-05-14 ENCOUNTER — Ambulatory Visit
Admission: RE | Admit: 2018-05-14 | Discharge: 2018-05-14 | Disposition: A | Discharge status: Home / Self Care | Payer: BC Managed Care – PPO | Source: Ambulatory Visit | Attending: Family Medicine | Admitting: Family Medicine

## 2018-05-14 DIAGNOSIS — Z1231 Encounter for screening mammogram for malignant neoplasm of breast: Secondary | ICD-10-CM

## 2019-05-09 ENCOUNTER — Other Ambulatory Visit: Payer: Self-pay | Admitting: Family Medicine

## 2019-05-09 DIAGNOSIS — Z1231 Encounter for screening mammogram for malignant neoplasm of breast: Secondary | ICD-10-CM

## 2019-06-29 ENCOUNTER — Ambulatory Visit: Payer: BC Managed Care – PPO | Attending: Internal Medicine

## 2019-06-29 DIAGNOSIS — Z23 Encounter for immunization: Secondary | ICD-10-CM | POA: Insufficient documentation

## 2019-06-29 NOTE — Progress Notes (Signed)
   Covid-19 Vaccination Clinic  Name:  Julie Benton    MRN: [id number] DOB: 1955/10/17  06/29/2019  [name] was observed post Covid-19 immunization for 15 minutes without incidence. She was provided with Vaccine Information Sheet and instruction to access the V-Safe system.   [name] was instructed to call 911 with any severe reactions post vaccine: Marland Kitchen Difficulty breathing  . Swelling of your face and throat  . A fast heartbeat  . A bad rash all over your body  . Dizziness and weakness    Immunizations Administered    Name Date Dose VIS Date Route   Pfizer COVID-19 Vaccine 06/29/2019 12:01 PM 0.3 mL 05/20/2019 Intramuscular   Manufacturer: [employer], [employer]   Lot: [id number]   NDC: [address]-3

## 2019-06-30 ENCOUNTER — Other Ambulatory Visit: Payer: Self-pay

## 2019-06-30 ENCOUNTER — Ambulatory Visit
Admission: RE | Admit: 2019-06-30 | Discharge: 2019-06-30 | Disposition: A | Discharge status: Home / Self Care | Payer: BC Managed Care – PPO | Source: Ambulatory Visit | Attending: Family Medicine | Admitting: Family Medicine

## 2019-06-30 DIAGNOSIS — Z1231 Encounter for screening mammogram for malignant neoplasm of breast: Secondary | ICD-10-CM

## 2019-07-18 ENCOUNTER — Ambulatory Visit: Payer: BC Managed Care – PPO | Attending: Internal Medicine

## 2019-07-18 DIAGNOSIS — Z23 Encounter for immunization: Secondary | ICD-10-CM | POA: Insufficient documentation

## 2019-07-18 NOTE — Progress Notes (Signed)
   Covid-19 Vaccination Clinic  Name:  [name]    MRN: [id number] DOB: [birth year]  07/18/2019  Julie Benton was observed post Covid-19 immunization for 15 minutes without incidence. She was provided with Vaccine Information Sheet and instruction to access the V-Safe system.   Julie Benton was instructed to call 911 with any severe reactions post vaccine: Marland Kitchen Difficulty breathing  . Swelling of your face and throat  . A fast heartbeat  . A bad rash all over your body  . Dizziness and weakness    Immunizations Administered    Name Date Dose VIS Date Route   Pfizer COVID-19 Vaccine 07/18/2019  3:10 PM 0.3 mL 05/20/2019 Intramuscular   Manufacturer: [employer], [employer]   Lot: [id number]   NDC: [address]-0

## 2020-05-29 ENCOUNTER — Other Ambulatory Visit: Payer: Self-pay | Admitting: Family Medicine

## 2020-05-29 DIAGNOSIS — Z1231 Encounter for screening mammogram for malignant neoplasm of breast: Secondary | ICD-10-CM

## 2020-07-12 ENCOUNTER — Ambulatory Visit: Payer: BC Managed Care – PPO

## 2020-08-28 ENCOUNTER — Inpatient Hospital Stay
Admission: RE | Admit: 2020-08-28 | Discharge: 2020-08-28 | Disposition: A | Discharge status: Home / Self Care | Payer: Medicare PPO | Source: Ambulatory Visit | Attending: Family Medicine | Admitting: Family Medicine

## 2020-08-28 ENCOUNTER — Inpatient Hospital Stay: Admission: RE | Admit: 2020-08-28 | Payer: Self-pay | Source: Ambulatory Visit

## 2020-08-28 ENCOUNTER — Other Ambulatory Visit: Payer: Self-pay

## 2020-08-28 DIAGNOSIS — Z1231 Encounter for screening mammogram for malignant neoplasm of breast: Secondary | ICD-10-CM

## 2020-08-30 ENCOUNTER — Other Ambulatory Visit: Payer: Self-pay | Admitting: Family Medicine

## 2020-08-30 DIAGNOSIS — R928 Other abnormal and inconclusive findings on diagnostic imaging of breast: Secondary | ICD-10-CM

## 2020-09-18 ENCOUNTER — Ambulatory Visit
Admission: RE | Admit: 2020-09-18 | Discharge: 2020-09-18 | Disposition: A | Discharge status: Home / Self Care | Payer: Medicare PPO | Source: Ambulatory Visit | Attending: Family Medicine | Admitting: Family Medicine

## 2020-09-18 ENCOUNTER — Other Ambulatory Visit: Payer: Self-pay

## 2020-09-18 DIAGNOSIS — R928 Other abnormal and inconclusive findings on diagnostic imaging of breast: Secondary | ICD-10-CM

## 2020-12-24 IMAGING — MG DIGITAL SCREENING BILAT W/ TOMO W/ CAD
8 series · 8 of 24 positions shown · non-contrast
Comparison: Previous exam(s).

CLINICAL DATA: Screening.

EXAM:
DIGITAL SCREENING BILATERAL MAMMOGRAM WITH TOMO AND CAD

[L MLO synth-2D]
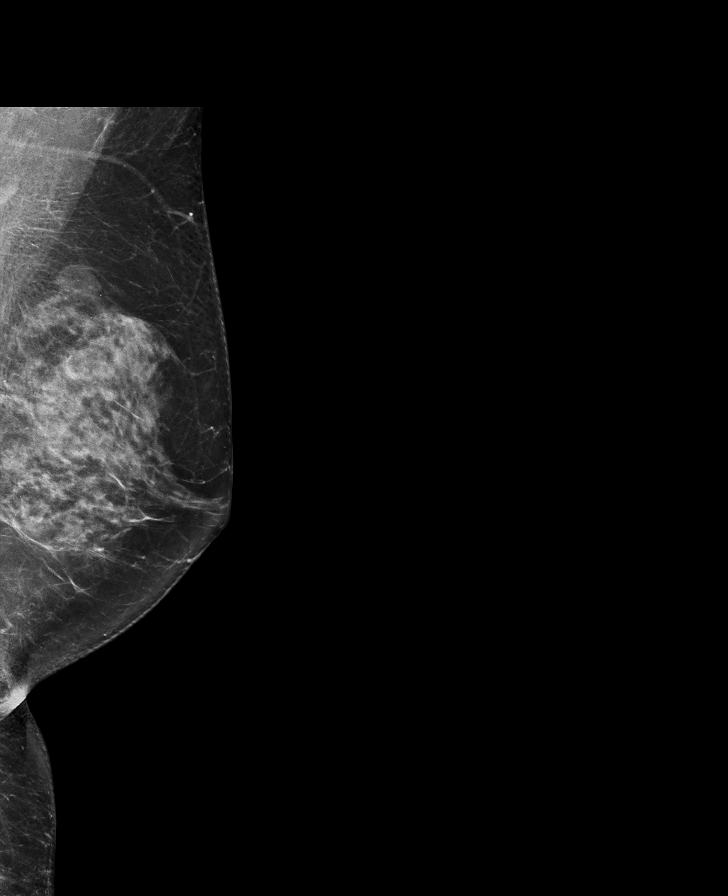

[R MLO synth-2D]
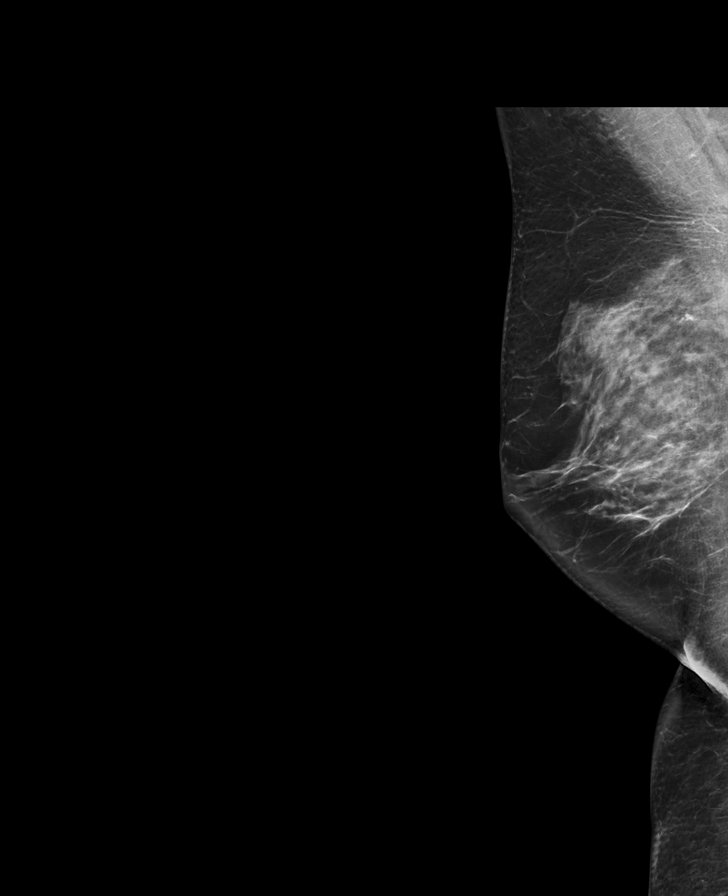

[R CC synth-2D]
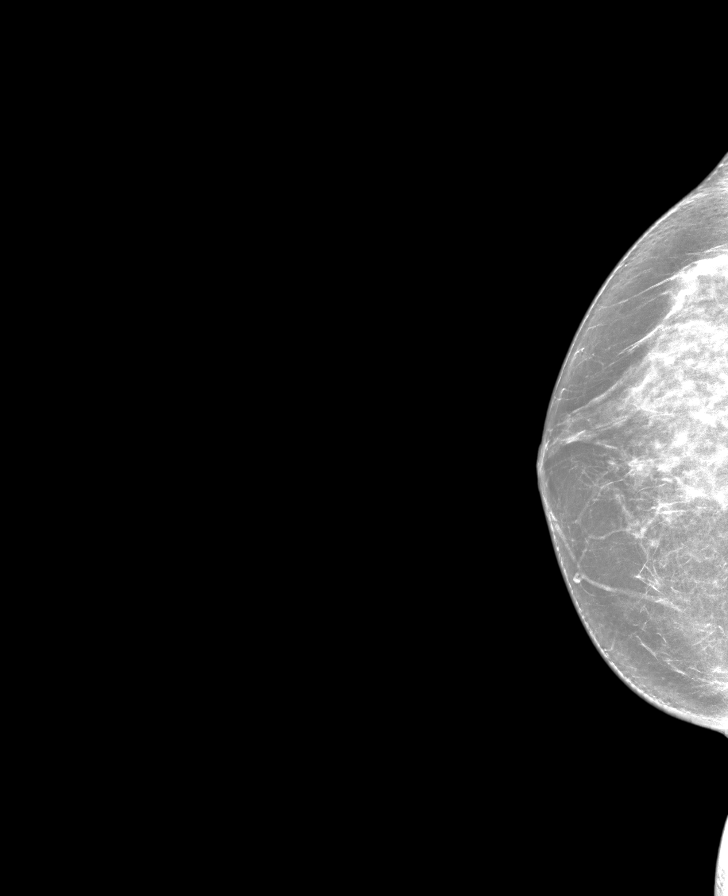

[L CC synth-2D]
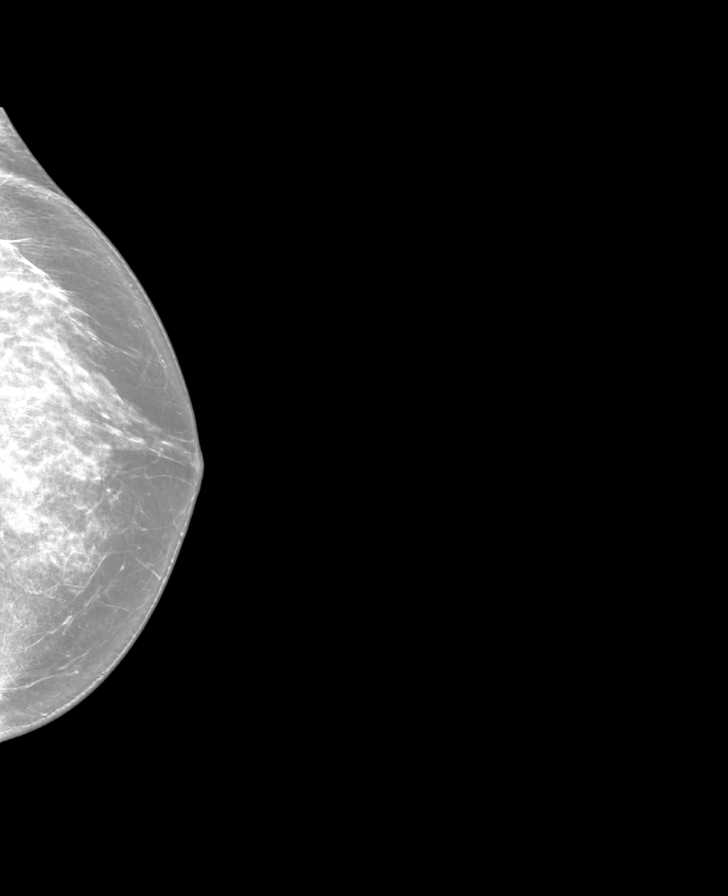

[R MLO tomo · tomo slice 41/80.0]
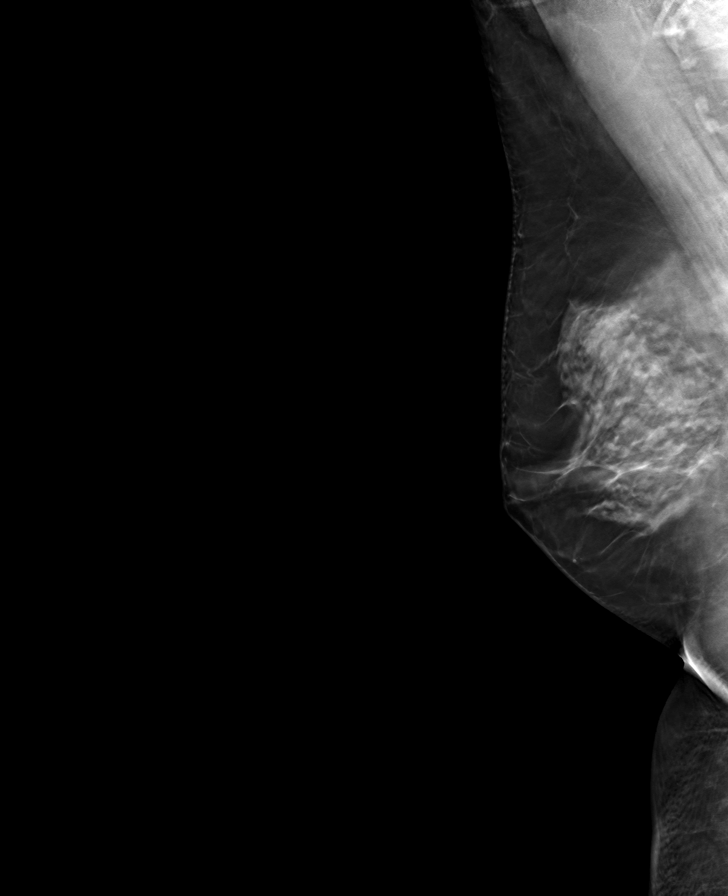

[R CC tomo · tomo slice 35/70.0]
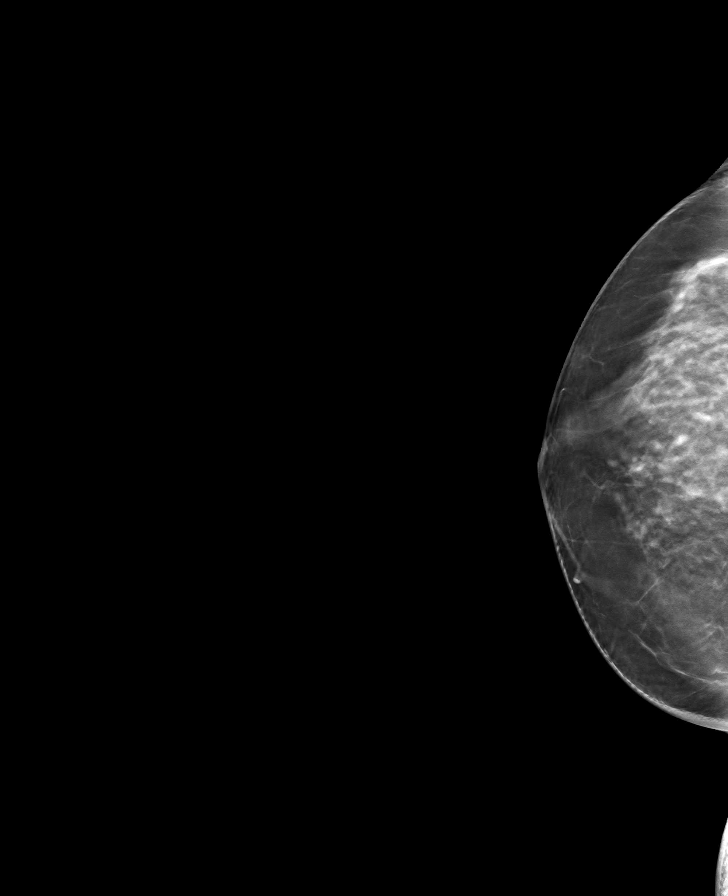

[L CC tomo · tomo slice 37/74.0]
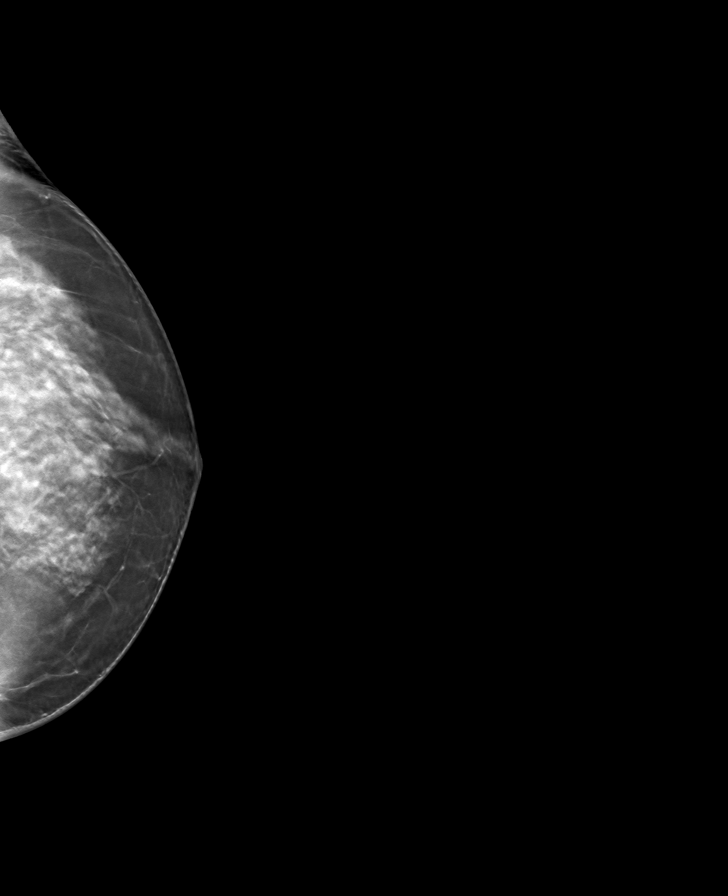

[L MLO tomo · tomo slice 39/77.0]
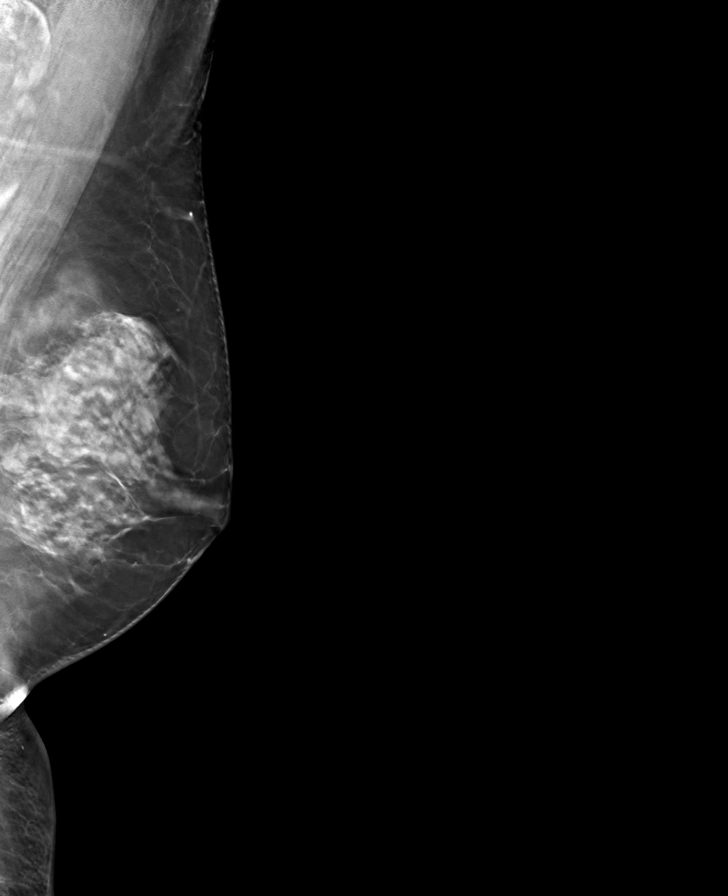

[8 of 24 positions shown; findings below may reference images not displayed]

ACR Breast Density Category c: The breast tissue is heterogeneously
dense, which may obscure small masses.
FINDINGS: There are no findings suspicious for malignancy. Images were
processed with CAD.
IMPRESSION: No mammographic evidence of malignancy. A result letter of this
screening mammogram will be mailed directly to the patient.

RECOMMENDATION:
Screening mammogram in one year. (Code:[id number])

BI-RADS CATEGORY  1: Negative.

## 2021-08-19 ENCOUNTER — Other Ambulatory Visit: Payer: Self-pay | Admitting: Family Medicine

## 2021-08-19 DIAGNOSIS — Z1231 Encounter for screening mammogram for malignant neoplasm of breast: Secondary | ICD-10-CM

## 2021-08-29 ENCOUNTER — Ambulatory Visit
Admission: RE | Admit: 2021-08-29 | Discharge: 2021-08-29 | Disposition: A | Discharge status: Home / Self Care | Payer: Medicare PPO | Source: Ambulatory Visit | Attending: Family Medicine | Admitting: Family Medicine

## 2021-08-29 DIAGNOSIS — Z1231 Encounter for screening mammogram for malignant neoplasm of breast: Secondary | ICD-10-CM

## 2022-07-22 ENCOUNTER — Encounter: Payer: Self-pay | Admitting: Internal Medicine

## 2022-07-22 ENCOUNTER — Other Ambulatory Visit: Payer: Self-pay | Admitting: Family Medicine

## 2022-07-22 DIAGNOSIS — Z1231 Encounter for screening mammogram for malignant neoplasm of breast: Secondary | ICD-10-CM

## 2022-08-15 ENCOUNTER — Encounter: Payer: Self-pay | Admitting: Internal Medicine

## 2022-08-15 ENCOUNTER — Ambulatory Visit (AMBULATORY_SURGERY_CENTER): Payer: Medicare PPO

## 2022-08-15 VITALS — Ht 63.0 in | Wt 155.0 lb

## 2022-08-15 DIAGNOSIS — Z1211 Encounter for screening for malignant neoplasm of colon: Secondary | ICD-10-CM

## 2022-08-15 NOTE — Progress Notes (Signed)

## 2022-09-04 ENCOUNTER — Ambulatory Visit
Admission: RE | Admit: 2022-09-04 | Discharge: 2022-09-04 | Disposition: A | Discharge status: Home / Self Care | Payer: Medicare PPO | Source: Ambulatory Visit | Attending: Family Medicine | Admitting: Family Medicine

## 2022-09-04 DIAGNOSIS — Z1231 Encounter for screening mammogram for malignant neoplasm of breast: Secondary | ICD-10-CM

## 2022-09-10 ENCOUNTER — Encounter: Payer: Self-pay | Admitting: Internal Medicine

## 2022-09-10 ENCOUNTER — Ambulatory Visit (AMBULATORY_SURGERY_CENTER): Payer: Medicare PPO | Admitting: Internal Medicine

## 2022-09-10 VITALS — BP 140/77 | HR 70 | Temp 98.0°F | Resp 11 | Ht 63.0 in | Wt 155.0 lb

## 2022-09-10 DIAGNOSIS — Z86010 Personal history of colonic polyps: Secondary | ICD-10-CM | POA: Insufficient documentation

## 2022-09-10 DIAGNOSIS — Z860101 Personal history of adenomatous and serrated colon polyps: Secondary | ICD-10-CM | POA: Insufficient documentation

## 2022-09-10 DIAGNOSIS — D125 Benign neoplasm of sigmoid colon: Secondary | ICD-10-CM

## 2022-09-10 DIAGNOSIS — Z1211 Encounter for screening for malignant neoplasm of colon: Secondary | ICD-10-CM | POA: Diagnosis not present

## 2022-09-10 MED ORDER — SODIUM CHLORIDE 0.9 % IV SOLN: 500.0000 mL | Freq: Once | INTRAVENOUS | Status: DC

## 2022-09-10 NOTE — Progress Notes (Signed)
Called to room to assist during endoscopic procedure.  Patient ID and intended procedure confirmed with present staff. Received instructions for my participation in the procedure from the performing physician.  

## 2022-09-10 NOTE — Progress Notes (Signed)
A and O x3. Report to RN. Tolerated MAC anesthesia well. 

## 2022-09-10 NOTE — Op Note (Signed)
[place] Patient Name: Julie Benton Procedure Date: 09/10/2022 8:34 AM MRN: [id number] Endoscopist: Gatha Mayer , MD, [id number] Age: 67 Referring MD:  Date of Birth: [birth year] Gender: Female Account #: [id number] Procedure:                Colonoscopy Indications:              Screening for colorectal malignant neoplasm, Last                            colonoscopy: 2008 Medicines:                Monitored Anesthesia Care Procedure:                Pre-Anesthesia Assessment:                           - Prior to the procedure, a History and Physical                            was performed, and patient medications and                            allergies were reviewed. The patient's tolerance of                            previous anesthesia was also reviewed. The risks                            and benefits of the procedure and the sedation                            options and risks were discussed with the patient.                            All questions were answered, and informed consent                            was obtained. Prior Anticoagulants: The patient has                            taken no anticoagulant or antiplatelet agents. ASA                            Grade Assessment: II - A patient with mild systemic                            disease. After reviewing the risks and benefits,                            the patient was deemed in satisfactory condition to                            undergo the procedure.  After obtaining informed consent, the colonoscope                            was passed under direct vision. Throughout the                            procedure, the patient's blood pressure, pulse, and                            oxygen saturations were monitored continuously. The                            Olympus [id number]) Colonoscope was                            introduced through the anus and advanced  to the the                            cecum, identified by appendiceal orifice and                            ileocecal valve. The colonoscopy was somewhat                            difficult due to significant looping. Successful                            completion of the procedure was aided by                            straightening and shortening the scope to obtain                            bowel loop reduction and applying abdominal                            pressure. The patient tolerated the procedure well.                            The quality of the bowel preparation was fair. The                            ileocecal valve, appendiceal orifice, and rectum                            were photographed. The bowel preparation used was                            Miralax via split dose instruction. Scope In: 8:42:10 AM Scope Out: 8:57:55 AM Scope Withdrawal Time: 0 hours 8 minutes 34 seconds  Total Procedure Duration: 0 hours 15 minutes 45 seconds  Findings:                 The perianal and digital rectal examinations were  normal.                           A diminutive polyp was found in the sigmoid colon.                            The polyp was sessile. The polyp was removed with a                            cold snare. Resection and retrieval were complete.                            Verification of patient identification for the                            specimen was done. Estimated blood loss was minimal.                           Multiple diverticula were found in the sigmoid                            colon.                           A moderate amount of stool was found in the                            ascending colon and in the cecum, interfering with                            visualization. Lavage of the area was performed,                            resulting in incomplete clearance with fair                            visualization.                            The exam was otherwise without abnormality on                            direct and retroflexion views. Complications:            No immediate complications. Estimated Blood Loss:     Estimated blood loss was minimal. Impression:               - Preparation of the colon was fair.                           - One diminutive polyp in the sigmoid colon,                            removed with a cold snare. Resected and retrieved.                           -  Diverticulosis in the sigmoid colon.                           - Stool in the ascending colon and in the cecum.                           - The examination was otherwise normal on direct                            and retroflexion views. Recommendation:           - Patient has a contact number available for                            emergencies. The signs and symptoms of potential                            delayed complications were discussed with the                            patient. Return to normal activities tomorrow.                            Written discharge instructions were provided to the                            patient.                           - Resume previous diet.                           - Continue present medications.                           - Await pathology results.                           - Will need closer f/u due to fair prep - use                            different prep Gatha Mayer, MD 09/10/2022 9:05:34 AM This report has been signed electronically.

## 2022-09-10 NOTE — Progress Notes (Signed)
[facility] History and Physical   Primary Care Physician:  Janora Norlander, DO   Reason for Procedure:   CRCA screening  Plan:  colonoscopy       HPI: Julie Benton is a 67 y.o. female for screening exam   History reviewed. No pertinent past medical history.  Past Surgical History:  Procedure Laterality Date   COLONOSCOPY      Prior to Admission medications   Medication Sig Start Date End Date Taking? Authorizing Provider  Biotin 5000 MCG TABS Take by mouth.   Yes [provider]  Calcium-Phosphorus-Vitamin D (CITRACAL +D3 PO) Take by mouth.   Yes [provider]  Chromium-Cinnamon (CINNAMON PLUS CHROMIUM) [phone] MCG-MG CAPS Take by mouth.   Yes [provider]  Magnesium 250 MG TABS Take by mouth.   Yes [provider]  Milk Thistle 1000 MG CAPS Take by mouth.   Yes [provider]  Omega-3 Fatty Acids (FISH OIL TRIPLE STRENGTH) 1400 MG CAPS Take 1,200 mg by mouth.   Yes [provider]  Valerian Root 100 MG CAPS Take by mouth.   Yes [provider]  vitamin E 400 UNIT capsule Take 400 Units by mouth daily.   Yes [provider]  diphenhydrAMINE (BENADRYL) 25 mg capsule Take 25 mg by mouth every 6 (six) hours as needed.    [provider]  NEOMYCIN-POLYMYXIN-HYDROCORTISONE (CORTISPORIN) 1 % SOLN otic solution 3-4 drops to affected ear or ears with a cotton wick in place 4 times daily Patient not taking: Reported on 08/15/2022 11/28/15   Chipper Herb, MD    Current Outpatient Medications  Medication Sig Dispense Refill   Biotin 5000 MCG TABS Take by mouth.     Calcium-Phosphorus-Vitamin D (CITRACAL +D3 PO) Take by mouth.     Chromium-Cinnamon (CINNAMON PLUS CHROMIUM) [phone] MCG-MG CAPS Take by mouth.     Magnesium 250 MG TABS Take by mouth.     Milk Thistle 1000 MG CAPS Take by mouth.     Omega-3 Fatty Acids (FISH OIL TRIPLE STRENGTH) 1400 MG CAPS Take 1,200 mg by  mouth.     Valerian Root 100 MG CAPS Take by mouth.     vitamin E 400 UNIT capsule Take 400 Units by mouth daily.     diphenhydrAMINE (BENADRYL) 25 mg capsule Take 25 mg by mouth every 6 (six) hours as needed.     NEOMYCIN-POLYMYXIN-HYDROCORTISONE (CORTISPORIN) 1 % SOLN otic solution 3-4 drops to affected ear or ears with a cotton wick in place 4 times daily (Patient not taking: Reported on 08/15/2022) 10 mL 0   Current Facility-Administered Medications  Medication Dose Route Frequency Provider Last Rate Last Admin   0.9 %  sodium chloride infusion  500 mL Intravenous Once Gatha Mayer, MD        Allergies as of 09/10/2022 - Review Complete 09/10/2022  Allergen Reaction Noted   Asa [aspirin]  08/11/2013   Hydrocodone-acetaminophen Other (See Comments) 12/19/2015   Keflex [cephalexin]  08/11/2013    Family History  Problem Relation Age of Onset   Diabetes Mother    COPD Father    Stroke Father    Breast cancer Neg Hx    Colon cancer Neg Hx    Colon polyps Neg Hx    Esophageal cancer Neg Hx    Rectal cancer Neg Hx    Stomach cancer Neg Hx     Social History   Socioeconomic History   Marital status: Married  Spouse name: Not on file   Number of children: Not on file   Years of education: Not on file   Highest education level: Not on file  Occupational History   Not on file  Tobacco Use   Smoking status: Never   Smokeless tobacco: Not on file  Vaping Use   Vaping Use: Never used  Substance and Sexual Activity   Alcohol use: No   Drug use: No   Sexual activity: Not on file  Other Topics Concern   Not on file  Social History Narrative   Not on file   Social Determinants of Health   Financial Resource Strain: Not on file  Food Insecurity: Not on file  Transportation Needs: Not on file  Physical Activity: Not on file  Stress: Not on file  Social Connections: Not on file  Intimate Partner Violence: Not on file    Review of Systems:  All other review of  systems negative except as mentioned in the HPI.  Physical Exam: Vital signs BP (!) 158/79   Pulse 73   Temp 98 F (36.7 C)   Ht 5\' 3"  (1.6 m)   Wt 155 lb (70.3 kg)   SpO2 98%   BMI 27.46 kg/m   General:   Alert,  Well-developed, well-nourished, pleasant and cooperative in NAD Lungs:  Clear throughout to auscultation.   Heart:  Regular rate and rhythm; no murmurs, clicks, rubs,  or gallops. Abdomen:  Soft, nontender and nondistended. Normal bowel sounds.   Neuro/Psych:  Alert and cooperative. Normal mood and affect. A and O x 3   @[name]  [name], MD, [facility] [phone] (pager) 09/10/2022 8:32 AM@

## 2022-09-10 NOTE — Patient Instructions (Addendum)
I found and removed one tiny polyp that looks benign.  You have diverticulosis - thickened muscle rings and pouches in the colon wall. Please read the handout about this condition.  The prep did not work as well as desired so you will need closer follow-up - probably in 1 year.  I appreciate the opportunity to care for you. Gatha Mayer, MD, [facility]   Discharge instructions given. Handouts on polyps and Diverticulosis. Resume previous medications. YOU HAD AN ENDOSCOPIC PROCEDURE TODAY AT [facility]:   Refer to the procedure report that was given to you for any specific questions about what was found during the examination.  If the procedure report does not answer your questions, please call your gastroenterologist to clarify.  If you requested that your care partner not be given the details of your procedure findings, then the procedure report has been included in a sealed envelope for you to review at your convenience later.  YOU SHOULD EXPECT: Some feelings of bloating in the abdomen. Passage of more gas than usual.  Walking can help get rid of the air that was put into your GI tract during the procedure and reduce the bloating. If you had a lower endoscopy (such as a colonoscopy or flexible sigmoidoscopy) you may notice spotting of blood in your stool or on the toilet paper. If you underwent a bowel prep for your procedure, you may not have a normal bowel movement for a few days.  Please Note:  You might notice some irritation and congestion in your nose or some drainage.  This is from the oxygen used during your procedure.  There is no need for concern and it should clear up in a day or so.  SYMPTOMS TO REPORT IMMEDIATELY:  Following lower endoscopy (colonoscopy or flexible sigmoidoscopy):  Excessive amounts of blood in the stool  Significant tenderness or worsening of abdominal pains  Swelling of the abdomen that is new, acute  Fever of 100F or higher   For urgent  or emergent issues, a gastroenterologist can be reached at any hour by calling [phone]. Do not use MyChart messaging for urgent concerns.    DIET:  We do recommend a small meal at first, but then you may proceed to your regular diet.  Drink plenty of fluids but you should avoid alcoholic beverages for 24 hours.  ACTIVITY:  You should plan to take it easy for the rest of today and you should NOT DRIVE or use heavy machinery until tomorrow (because of the sedation medicines used during the test).    FOLLOW UP: Our staff will call the number listed on your records the next business day following your procedure.  We will call around 7:15- 8:00 am to check on you and address any questions or concerns that you may have regarding the information given to you following your procedure. If we do not reach you, we will leave a message.     If any biopsies were taken you will be contacted by phone or by letter within the next 1-3 weeks.  Please call us at [phone] if you have not heard about the biopsies in 3 weeks.    SIGNATURES/CONFIDENTIALITY: You and/or your care partner have signed paperwork which will be entered into your electronic medical record.  These signatures attest to the fact that that the information above on your After Visit Summary has been reviewed and is understood.  Full responsibility of the confidentiality of this discharge information lies  with you and/or your care-partner.

## 2022-09-11 ENCOUNTER — Telehealth: Payer: Self-pay

## 2022-09-11 ENCOUNTER — Other Ambulatory Visit: Payer: Self-pay | Admitting: Family Medicine

## 2022-09-11 DIAGNOSIS — Z1231 Encounter for screening mammogram for malignant neoplasm of breast: Secondary | ICD-10-CM

## 2022-09-11 NOTE — Telephone Encounter (Signed)
  Follow up Call-     09/10/2022    7:48 AM  Call back number  Post procedure Call Back phone  # [phone]  Permission to leave phone message Yes     Patient questions:  Do you have a fever, pain , or abdominal swelling? No. Pain Score  0 *  Have you tolerated food without any problems? Yes.    Have you been able to return to your normal activities? Yes.    Do you have any questions about your discharge instructions: Diet   No. Medications  No. Follow up visit  No.  Do you have questions or concerns about your Care? No.  Actions: * If pain score is 4 or above: No action needed, pain <4.

## 2022-09-22 ENCOUNTER — Encounter: Payer: Self-pay | Admitting: Internal Medicine

## 2022-09-22 DIAGNOSIS — Z86010 Personal history of colonic polyps: Secondary | ICD-10-CM

## 2022-09-22 DIAGNOSIS — Z860101 Personal history of adenomatous and serrated colon polyps: Secondary | ICD-10-CM

## 2022-09-22 HISTORY — DX: Personal history of adenomatous and serrated colon polyps: Z86.0101

## 2022-09-22 HISTORY — DX: Personal history of colonic polyps: Z86.010

## 2023-03-19 ENCOUNTER — Encounter: Payer: Self-pay | Admitting: Podiatry

## 2023-03-19 ENCOUNTER — Ambulatory Visit: Payer: Medicare PPO

## 2023-03-19 ENCOUNTER — Ambulatory Visit: Payer: Medicare PPO | Admitting: Podiatry

## 2023-03-19 DIAGNOSIS — M722 Plantar fascial fibromatosis: Secondary | ICD-10-CM

## 2023-03-19 NOTE — Patient Instructions (Signed)
VISIT SUMMARY:  During your visit, we discussed your recurrent heel pain, which is now more severe in your left foot. You mentioned that your orthotics are worn out and you've been trying to manage your symptoms with home exercises and using water bottles for relief. We also discussed how your frequent night-time care for your elderly parents might be contributing to your symptoms.  YOUR PLAN:  -PLANTAR FASCIITIS: Plantar Fasciitis is a condition that causes heel pain due to inflammation in the band of tissue that connects your heel bone to your toes. We administered injections in both heels to help reduce inflammation and pain. We also recommended a home physical therapy plan. We plan to refurbish your existing orthotics with softer material and a new heel pad. If there's no improvement, we may consider getting you a new pair of orthotics and possibly PRP injections, which can help promote healing. We also discussed the potential need for shockwave therapy, a treatment that was previously successful for you.  INSTRUCTIONS:  Please continue with your home exercises and the use of water bottles for relief. Follow the home physical therapy plan provided. We will reassess your condition in a month. If you experience any worsening of symptoms or side effects from the injections, please contact the clinic immediately.    Plantar Fasciitis (Heel Spur Syndrome) with Rehab The plantar fascia is a fibrous, ligament-like, soft-tissue structure that spans the bottom of the foot. Plantar fasciitis is a condition that causes pain in the foot due to inflammation of the tissue. SYMPTOMS  Pain and tenderness on the underneath side of the foot. Pain that worsens with standing or walking. CAUSES  Plantar fasciitis is caused by irritation and injury to the plantar fascia on the underneath side of the foot. Common mechanisms of injury include: Direct trauma to bottom of the foot. Damage to a small nerve that runs  under the foot where the main fascia attaches to the heel bone. Stress placed on the plantar fascia due to bone spurs. RISK INCREASES WITH:  Activities that place stress on the plantar fascia (running, jumping, pivoting, or cutting). Poor strength and flexibility. Improperly fitted shoes. Tight calf muscles. Flat feet. Failure to warm-up properly before activity. Obesity. PREVENTION Warm up and stretch properly before activity. Allow for adequate recovery between workouts. Maintain physical fitness: Strength, flexibility, and endurance. Cardiovascular fitness. Maintain a health body weight. Avoid stress on the plantar fascia. Wear properly fitted shoes, including arch supports for individuals who have flat feet.  PROGNOSIS  If treated properly, then the symptoms of plantar fasciitis usually resolve without surgery. However, occasionally surgery is necessary.  RELATED COMPLICATIONS  Recurrent symptoms that may result in a chronic condition. Problems of the lower back that are caused by compensating for the injury, such as limping. Pain or weakness of the foot during push-off following surgery. Chronic inflammation, scarring, and partial or complete fascia tear, occurring more often from repeated injections.  TREATMENT  Treatment initially involves the use of ice and medication to help reduce pain and inflammation. The use of strengthening and stretching exercises may help reduce pain with activity, especially stretches of the Achilles tendon. These exercises may be performed at home or with a therapist. Your caregiver may recommend that you use heel cups of arch supports to help reduce stress on the plantar fascia. Occasionally, corticosteroid injections are given to reduce inflammation. If symptoms persist for greater than 6 months despite non-surgical (conservative), then surgery may be recommended.   MEDICATION  If  pain medication is necessary, then nonsteroidal anti-inflammatory  medications, such as aspirin and ibuprofen, or other minor pain relievers, such as acetaminophen, are often recommended. Do not take pain medication within 7 days before surgery. Prescription pain relievers may be given if deemed necessary by your caregiver. Use only as directed and only as much as you need. Corticosteroid injections may be given by your caregiver. These injections should be reserved for the most serious cases, because they may only be given a certain number of times.  HEAT AND COLD Cold treatment (icing) relieves pain and reduces inflammation. Cold treatment should be applied for 10 to 15 minutes every 2 to 3 hours for inflammation and pain and immediately after any activity that aggravates your symptoms. Use ice packs or massage the area with a piece of ice (ice massage). Heat treatment may be used prior to performing the stretching and strengthening activities prescribed by your caregiver, physical therapist, or athletic trainer. Use a heat pack or soak the injury in warm water.  SEEK IMMEDIATE MEDICAL CARE IF: Treatment seems to offer no benefit, or the condition worsens. Any medications produce adverse side effects.  EXERCISES- RANGE OF MOTION (ROM) AND STRETCHING EXERCISES - Plantar Fasciitis (Heel Spur Syndrome) These exercises may help you when beginning to rehabilitate your injury. Your symptoms may resolve with or without further involvement from your physician, physical therapist or athletic trainer. While completing these exercises, remember:  Restoring tissue flexibility helps normal motion to return to the joints. This allows healthier, less painful movement and activity. An effective stretch should be held for at least 30 seconds. A stretch should never be painful. You should only feel a gentle lengthening or release in the stretched tissue.  RANGE OF MOTION - Toe Extension, Flexion Sit with your right / left leg crossed over your opposite knee. Grasp your toes  and gently pull them back toward the top of your foot. You should feel a stretch on the bottom of your toes and/or foot. Hold this stretch for 10 seconds. Now, gently pull your toes toward the bottom of your foot. You should feel a stretch on the top of your toes and or foot. Hold this stretch for 10 seconds. Repeat  times. Complete this stretch 3 times per day.   RANGE OF MOTION - Ankle Dorsiflexion, Active Assisted Remove shoes and sit on a chair that is preferably not on a carpeted surface. Place right / left foot under knee. Extend your opposite leg for support. Keeping your heel down, slide your right / left foot back toward the chair until you feel a stretch at your ankle or calf. If you do not feel a stretch, slide your bottom forward to the edge of the chair, while still keeping your heel down. Hold this stretch for 10 seconds. Repeat 3 times. Complete this stretch 2 times per day.   STRETCH  Gastroc, Standing Place hands on wall. Extend right / left leg, keeping the front knee somewhat bent. Slightly point your toes inward on your back foot. Keeping your right / left heel on the floor and your knee straight, shift your weight toward the wall, not allowing your back to arch. You should feel a gentle stretch in the right / left calf. Hold this position for 10 seconds. Repeat 3 times. Complete this stretch 2 times per day.  STRETCH  Soleus, Standing Place hands on wall. Extend right / left leg, keeping the other knee somewhat bent. Slightly point your toes inward on your  back foot. Keep your right / left heel on the floor, bend your back knee, and slightly shift your weight over the back leg so that you feel a gentle stretch deep in your back calf. Hold this position for 10 seconds. Repeat 3 times. Complete this stretch 2 times per day.  STRETCH  Gastrocsoleus, Standing  Note: This exercise can place a lot of stress on your foot and ankle. Please complete this exercise only if  specifically instructed by your caregiver.  Place the ball of your right / left foot on a step, keeping your other foot firmly on the same step. Hold on to the wall or a rail for balance. Slowly lift your other foot, allowing your body weight to press your heel down over the edge of the step. You should feel a stretch in your right / left calf. Hold this position for 10 seconds. Repeat this exercise with a slight bend in your right / left knee. Repeat 3 times. Complete this stretch 2 times per day.   STRENGTHENING EXERCISES - Plantar Fasciitis (Heel Spur Syndrome)  These exercises may help you when beginning to rehabilitate your injury. They may resolve your symptoms with or without further involvement from your physician, physical therapist or athletic trainer. While completing these exercises, remember:  Muscles can gain both the endurance and the strength needed for everyday activities through controlled exercises. Complete these exercises as instructed by your physician, physical therapist or athletic trainer. Progress the resistance and repetitions only as guided.  STRENGTH - Towel Curls Sit in a chair positioned on a non-carpeted surface. Place your foot on a towel, keeping your heel on the floor. Pull the towel toward your heel by only curling your toes. Keep your heel on the floor. Repeat 3 times. Complete this exercise 2 times per day.  STRENGTH - Ankle Inversion Secure one end of a rubber exercise band/tubing to a fixed object (table, pole). Loop the other end around your foot just before your toes. Place your fists between your knees. This will focus your strengthening at your ankle. Slowly, pull your big toe up and in, making sure the band/tubing is positioned to resist the entire motion. Hold this position for 10 seconds. Have your muscles resist the band/tubing as it slowly pulls your foot back to the starting position. Repeat 3 times. Complete this exercises 2 times per day.   Document Released: 05/26/2005 Document Revised: 08/18/2011 Document Reviewed: 09/07/2008 [facility] Patient Information 2014 [place], [place].

## 2023-03-20 NOTE — Progress Notes (Signed)
Orthotics sent to [employer] for refurbish  [employer], CFo, CFm

## 2023-03-22 NOTE — Progress Notes (Signed)
  Subjective:  Patient ID: Julie Benton, female    DOB: 14-Mar-1956,  MRN: [id number]  Chief Complaint  Patient presents with   Plantar Fasciitis    Left worse than right. Previously dealt with plantar fasciitis years ago. Has been trying stretching, icing, using custom heel cup orthotics (old pair). Flaring up for 7-8 months    Discussed the use of AI scribe software for clinical note transcription with the patient, who gave verbal consent to proceed.  History of Present Illness   The patient, with a history of heel pain, presents with recurrent symptoms. She previously underwent shock therapy years ago, which provided significant relief. The patient reports that her right foot was previously more affected, but currently, her left foot is worse. She has been using orthotics, which are now worn out and need to be replaced. The patient has been trying to manage her symptoms with home exercises and using water bottles for relief. She also reports that she frequently has to get up at night to care for her elderly parents, which may be contributing to her symptoms. The patient used to walk six to seven miles a day, but she is unable to do so now due to her heel pain.          Objective:    Physical Exam   MUSCULOSKELETAL: Warm, well-perfused foot with palpable pulses. Pain on palpation of the medial eminence of the plantar fascia at the insertion of the calcaneus.       No images are attached to the encounter.    Results   Procedure: Bilateral Heel Injections Description: Four milligrams of dexamethasone and ten milligrams of Kenalog with one cc of 0.5% Marcaine plain were injected into the bilateral heels at the medial band of the insertion of the plantar fascia at the point of maximal tenderness. The procedure was well tolerated with the application of a Band-Aid. Informed Consent: Counseling about the risks, benefits, and alternatives to the procedure was discussed. The patient  preferred the injection over oral prednisone due to fewer side effects and convenience.  RADIOLOGY X-rays of both feet: Heel spurs plantarly. No evidence of fracture or stress fracture.      Assessment:   1. Plantar fasciitis of left foot   2. Plantar fasciitis of right foot      Plan:  Patient was evaluated and treated and all questions answered.  Assessment and Plan    Plantar Fasciitis   She exhibits recurrent bilateral heel pain, with a history of prior shockwave therapy and X-rays revealing heel spurs. Pain is noted on palpation of the medial eminence of the plantar fascia at the calcaneus insertion. We administered bilateral heel injections with 4mg  of dexamethasone, 10mg  of Kenalog, and 1cc of 0.5% Marcaine plain. A home physical therapy plan is recommended. We plan to recover existing orthotics with softer material and a new heel pad at a cost of $90. Should there be no improvement, we will consider a new pair of orthotics at $500 and PRP injections at $350 per injection. A follow-up in 1 month is scheduled to assess progress and discuss the potential need for shockwave therapy.          Return in about 1 month (around 04/19/2023) for recheck plantar fasciitis.

## 2023-04-09 ENCOUNTER — Telehealth: Payer: Self-pay | Admitting: Podiatry

## 2023-04-09 NOTE — Telephone Encounter (Signed)
I just check and they have not arrived yet. We will call when in, Thank you

## 2023-04-09 NOTE — Telephone Encounter (Signed)
Pt left message today at 803am asking for status of orthotics. Please advise?

## 2023-04-09 NOTE — Telephone Encounter (Signed)
Left message for pt that orthotics are not in yet and she should get a call when they come in to get an appt to pick them up.

## 2023-04-28 ENCOUNTER — Ambulatory Visit: Payer: Medicare PPO | Admitting: Podiatry

## 2023-04-28 ENCOUNTER — Encounter: Payer: Self-pay | Admitting: Podiatry

## 2023-04-28 DIAGNOSIS — M722 Plantar fascial fibromatosis: Secondary | ICD-10-CM | POA: Diagnosis not present

## 2023-04-28 NOTE — Patient Instructions (Signed)
The shoe brand I mentioned are called [employer], I would get them true to your size. The ones I had on today in the office are called [employer]

## 2023-04-29 ENCOUNTER — Telehealth: Payer: Self-pay | Admitting: Podiatry

## 2023-04-29 NOTE — Telephone Encounter (Signed)
Pt called stating she was on her mychart and it is showing [facility] for [facility] 11/19 by Dr Jamse Arn and she just came in yesterday to pick up orthotics. She just wants to make sure it does not mess up her insurance.

## 2023-04-29 NOTE — Telephone Encounter (Signed)
Called and left message for pt that the xray's are from date of service 10/10 your last visit. He just signed off on them and the note so that is why they are just showing up. And Dr Jamse Arn was assisting him on that day so that is why his name is there as well.

## 2023-04-30 NOTE — Progress Notes (Signed)
  Subjective:  Patient ID: Julie Benton, female    DOB: 1955-10-20,  MRN: [id number]  Chief Complaint  Patient presents with   Plantar Fasciitis    Patient is here to pick up orthotics      Discussed the use of AI scribe software for clinical note transcription with the patient, who gave verbal consent to proceed.  History of Present Illness   She returns for follow-up doing much better after the injection she has been working on the therapy plan.         Objective:    Physical Exam   MUSCULOSKELETAL: Warm, well-perfused foot with palpable pulses.  Very mild pain on palpation of the medial eminence of the plantar fascia at the insertion of the calcaneus.       No images are attached to the encounter.    Results   Procedure: Bilateral Heel Injections Description: Four milligrams of dexamethasone and ten milligrams of Kenalog with one cc of 0.5% Marcaine plain were injected into the bilateral heels at the medial band of the insertion of the plantar fascia at the point of maximal tenderness. The procedure was well tolerated with the application of a Band-Aid. Informed Consent: Counseling about the risks, benefits, and alternatives to the procedure was discussed. The patient preferred the injection over oral prednisone due to fewer side effects and convenience.  RADIOLOGY X-rays of both feet: Heel spurs plantarly. No evidence of fracture or stress fracture.      Assessment:   Encounter Diagnoses  Name Primary?   Plantar fasciitis of left foot Yes   Plantar fasciitis of right foot      Plan:  Patient was evaluated and treated and all questions answered.  Assessment and Plan    Plantar Fasciitis   Her orthotics were refurbished and dispensed today.  She should continue her therapy plan.  Last injection was helpful and today did not have significant pain warranting another injection.  She also previously had improvement with shockwave therapy.  We will continue her  home therapy plan and reassess in 1 month to see if further treatment will be necessary.      Return in about 1 month (around 04/19/2023) for recheck plantar fasciitis.

## 2023-06-11 ENCOUNTER — Ambulatory Visit: Payer: Medicare PPO | Admitting: Podiatry

## 2023-07-02 ENCOUNTER — Encounter: Payer: Self-pay | Admitting: Internal Medicine

## 2023-09-07 ENCOUNTER — Ambulatory Visit
Admission: RE | Admit: 2023-09-07 | Discharge: 2023-09-07 | Disposition: A | Discharge status: Home / Self Care | Payer: Medicare PPO | Source: Ambulatory Visit | Attending: Family Medicine | Admitting: Family Medicine

## 2023-09-07 DIAGNOSIS — Z1231 Encounter for screening mammogram for malignant neoplasm of breast: Secondary | ICD-10-CM

## 2023-09-15 ENCOUNTER — Ambulatory Visit (AMBULATORY_SURGERY_CENTER): Payer: Medicare PPO | Admitting: *Deleted

## 2023-09-15 VITALS — Ht 63.0 in | Wt 159.0 lb

## 2023-09-15 DIAGNOSIS — Z860100 Personal history of colon polyps, unspecified: Secondary | ICD-10-CM

## 2023-09-15 MED ORDER — PEG 3350-KCL-NA BICARB-NACL 420 G PO SOLR
4000.0000 mL | Freq: Once | ORAL | 0 refills | Status: AC
Start: 2023-09-15 — End: 2023-09-15

## 2023-09-15 NOTE — Progress Notes (Signed)
 Pt's name and DOB verified at the beginning of the pre-visit wit 2 identifiers  Pt denies any difficulty with ambulating,sitting, laying down or rolling side to side  Pt has no issues with ambulation   Pt has no issues moving head neck or swallowing  No egg or soy allergy known to patient    Pt denies ever being  intubated  No FH of Malignant Hyperthermia  Pt is not on diet pills or shots  Pt is not on home 02   Pt is not on blood thinners   Pt has frequent issues with constipation RN instructed pt to use Miralax per bottles instructions a week before prep days. Pt states they will  Pt is not on dialysis  Pt denise any abnormal heart rhythms   Pt denies any upcoming cardiac testing  Patient's chart reviewed by Cathlyn Parsons CNRA prior to pre-visit and patient appropriate for the [facility].  Pre-visit completed and red dot placed by patient's name on their procedure day (on provider's schedule).    Visit by phone  Pt states weight is 159 lb  IInstructions reviewed. Pt given , [facility] main # and MD on call # prior to instructions.  Pt states understanding of instructions. Instructed to review again prior to procedure. Pt states they will.

## 2023-09-24 ENCOUNTER — Encounter: Payer: Self-pay | Admitting: Internal Medicine

## 2023-09-29 ENCOUNTER — Encounter: Payer: Self-pay | Admitting: Internal Medicine

## 2023-09-29 ENCOUNTER — Ambulatory Visit (AMBULATORY_SURGERY_CENTER): Payer: Medicare PPO | Admitting: Internal Medicine

## 2023-09-29 VITALS — BP 132/72 | HR 72 | Temp 98.8°F | Resp 73 | Ht 63.0 in | Wt 159.0 lb

## 2023-09-29 DIAGNOSIS — K5730 Diverticulosis of large intestine without perforation or abscess without bleeding: Secondary | ICD-10-CM | POA: Diagnosis not present

## 2023-09-29 DIAGNOSIS — D123 Benign neoplasm of transverse colon: Secondary | ICD-10-CM

## 2023-09-29 DIAGNOSIS — Z1211 Encounter for screening for malignant neoplasm of colon: Secondary | ICD-10-CM

## 2023-09-29 DIAGNOSIS — Z860101 Personal history of adenomatous and serrated colon polyps: Secondary | ICD-10-CM

## 2023-09-29 DIAGNOSIS — Z860100 Personal history of colon polyps, unspecified: Secondary | ICD-10-CM

## 2023-09-29 DIAGNOSIS — K635 Polyp of colon: Secondary | ICD-10-CM | POA: Diagnosis not present

## 2023-09-29 MED ORDER — SODIUM CHLORIDE 0.9 % IV SOLN
500.0000 mL | Freq: Once | INTRAVENOUS | Status: DC
Start: 2023-09-29 — End: 2023-09-29

## 2023-09-29 NOTE — Progress Notes (Signed)
 [facility] History and Physical   Primary Care Physician:  Pcp, No   Reason for Procedure:    Encounter Diagnosis  Name Primary?   History of colonic polyps Yes     Plan:    colonoscopy     HPI: Julie Benton is a 68 y.o. female here for surveillance colonoscopy. There was a diminutive adenoma w/ fair prep in 2024.  Past Medical History:  Diagnosis Date   Hx of adenomatous polyp of colon 09/22/2022    Past Surgical History:  Procedure Laterality Date   COLONOSCOPY     Dental procedures     TONSILECTOMY, ADENOIDECTOMY, BILATERAL MYRINGOTOMY AND TUBES      Prior to Admission medications   Medication Sig Start Date End Date Taking? Authorizing Provider  Biotin 5000 MCG TABS Take by mouth.   Yes [provider]  Calcium-Phosphorus-Vitamin D (CITRACAL +D3 PO) Take by mouth.   Yes [provider]  Chromium-Cinnamon (CINNAMON PLUS CHROMIUM) [phone] MCG-MG CAPS Take by mouth.   Yes [provider]  diphenhydrAMINE HCl (ALLERGY MED PO) Take by mouth.   Yes [provider]  fluticasone (FLONASE) 50 MCG/ACT nasal spray Place into both nostrils daily.   Yes [provider]  Magnesium 250 MG TABS Take by mouth.   Yes [provider]  Milk Thistle 1000 MG CAPS Take by mouth.   Yes [provider]  Multiple Vitamin (MULTIVITAMIN) capsule Take 1 capsule by mouth daily.   Yes [provider]  Omega-3 Fatty Acids (FISH OIL TRIPLE STRENGTH) 1400 MG CAPS Take 1,200 mg by mouth.   Yes [provider]  OVER THE COUNTER MEDICATION Estrovan   Yes [provider]  Valerian Root 100 MG CAPS Take by mouth.   Yes [provider]  vitamin E 400 UNIT capsule Take 400 Units by mouth daily.   Yes [provider]    Current Outpatient Medications  Medication Sig Dispense Refill   Biotin 5000 MCG TABS Take by mouth.     Calcium-Phosphorus-Vitamin D (CITRACAL +D3 PO) Take by  mouth.     Chromium-Cinnamon (CINNAMON PLUS CHROMIUM) [phone] MCG-MG CAPS Take by mouth.     diphenhydrAMINE HCl (ALLERGY MED PO) Take by mouth.     fluticasone (FLONASE) 50 MCG/ACT nasal spray Place into both nostrils daily.     Magnesium 250 MG TABS Take by mouth.     Milk Thistle 1000 MG CAPS Take by mouth.     Multiple Vitamin (MULTIVITAMIN) capsule Take 1 capsule by mouth daily.     Omega-3 Fatty Acids (FISH OIL TRIPLE STRENGTH) 1400 MG CAPS Take 1,200 mg by mouth.     OVER THE COUNTER MEDICATION Estrovan     Valerian Root 100 MG CAPS Take by mouth.     vitamin E 400 UNIT capsule Take 400 Units by mouth daily.     Current Facility-Administered Medications  Medication Dose Route Frequency Provider Last Rate Last Admin   0.9 %  sodium chloride  infusion  500 mL Intravenous Once Kenney Peacemaker, MD        Allergies as of 09/29/2023 - Review Complete 09/29/2023  Allergen Reaction Noted   Asa [aspirin] Nausea Only 08/11/2013   Hydrocodone-acetaminophen Other (See Comments) 12/19/2015   Keflex [cephalexin] Anxiety 08/11/2013    Family History  Problem Relation Age of Onset   Diabetes Mother    COPD Father    Stroke Father    Ovarian cancer Maternal Aunt 46 - 79  Breast cancer Cousin 40   Breast cancer Cousin 40   Colon cancer Neg Hx    Colon polyps Neg Hx    Esophageal cancer Neg Hx    Rectal cancer Neg Hx    Stomach cancer Neg Hx     Social History   Socioeconomic History   Marital status: Married    Spouse name: Not on file   Number of children: Not on file   Years of education: Not on file   Highest education level: Not on file  Occupational History   Not on file  Tobacco Use   Smoking status: Never   Smokeless tobacco: Not on file  Vaping Use   Vaping status: Never Used  Substance and Sexual Activity   Alcohol use: No   Drug use: No   Sexual activity: Not on file  Other Topics Concern   Not on file  Social History Narrative   Not on file   Social  Drivers of Health   Financial Resource Strain: Not on file  Food Insecurity: Not on file  Transportation Needs: Not on file  Physical Activity: Not on file  Stress: Not on file  Social Connections: Not on file  Intimate Partner Violence: Not on file    Review of Systems:  All other review of systems negative except as mentioned in the HPI.  Physical Exam: Vital signs BP (!) 151/88   Pulse 72   Temp 98.8 F (37.1 C)   Resp 18   Ht 5\' 3"  (1.6 m)   Wt 159 lb (72.1 kg)   SpO2 99%   BMI 28.17 kg/m   General:   Alert,  Well-developed, well-nourished, pleasant and cooperative in NAD Lungs:  Clear throughout to auscultation.   Heart:  Regular rate and rhythm; no murmurs, clicks, rubs,  or gallops. Abdomen:  Soft, nontender and nondistended. Normal bowel sounds.   Neuro/Psych:  Alert and cooperative. Normal mood and affect. A and O x 3   @Malachai Schalk  Tammie Fall, MD, [facility] [phone] (pager) 09/29/2023 1:34 PM@

## 2023-09-29 NOTE — Progress Notes (Signed)
 Pt A/O x 3, gd SR's, pleased with anesthesia, report to RN

## 2023-09-29 NOTE — Progress Notes (Signed)
 Called to room to assist during endoscopic procedure.  Patient ID and intended procedure confirmed with present staff. Received instructions for my participation in the procedure from the performing physician.

## 2023-09-29 NOTE — Progress Notes (Signed)
 Patient states she has had no change in medical history since her PAT visit

## 2023-09-29 NOTE — Op Note (Signed)
 [facility] Patient Name: Julie Benton Procedure Date: 09/29/2023 1:26 PM MRN: [id number] Endoscopist: Kenney Peacemaker , MD, [id number] Age: 68 Referring MD:  Date of Birth: 11-14-1955 Gender: Female Account #: [id number] Procedure:                Colonoscopy Indications:              Surveillance: History of adenomatous polyps,                            inadequate prep on last exam (<25yr) Medicines:                Monitored Anesthesia Care Procedure:                Pre-Anesthesia Assessment:                           - Prior to the procedure, a History and Physical                            was performed, and patient medications and                            allergies were reviewed. The patient's tolerance of                            previous anesthesia was also reviewed. The risks                            and benefits of the procedure and the sedation                            options and risks were discussed with the patient.                            All questions were answered, and informed consent                            was obtained. Prior Anticoagulants: The patient has                            taken no anticoagulant or antiplatelet agents. ASA                            Grade Assessment: I - A normal, healthy patient.                            After reviewing the risks and benefits, the patient                            was deemed in satisfactory condition to undergo the                            procedure.  After obtaining informed consent, the colonoscope                            was passed under direct vision. Throughout the                            procedure, the patient's blood pressure, pulse, and                            oxygen saturations were monitored continuously. The                            [id number]-HQ190L Colonoscope [id number] was introduced                            through the anus and advanced to the the  cecum,                            identified by appendiceal orifice and ileocecal                            valve. The colonoscopy was performed without                            difficulty. The patient tolerated the procedure                            well. The quality of the bowel preparation was                            good. The ileocecal valve, appendiceal orifice, and                            rectum were photographed. The bowel preparation                            used was GoLYTELY via split dose instruction. Scope In: 1:41:25 PM Scope Out: 1:58:52 PM Scope Withdrawal Time: 0 hours 14 minutes 39 seconds  Total Procedure Duration: 0 hours 17 minutes 27 seconds  Findings:                 The perianal and digital rectal examinations were                            normal.                           A 3 mm polyp was found in the transverse colon. The                            polyp was flat. The polyp was removed with a cold                            snare. Resection and retrieval were complete.  Verification of patient identification for the                            specimen was done.                           Multiple diverticula were found in the sigmoid                            colon.                           The exam was otherwise without abnormality on                            direct and retroflexion views. Complications:            No immediate complications. Estimated Blood Loss:     Estimated blood loss was minimal. Impression:               - One 3 mm polyp in the transverse colon, removed                            with a cold snare. Resected and retrieved.                           - Diverticulosis in the sigmoid colon.                           - The examination was otherwise normal on direct                            and retroflexion views.                           - Personal history of colonic polyp w/ diminutive                             adenoma removed 4/24 - prep was fair. Recommendation:           - Patient has a contact number available for                            emergencies. The signs and symptoms of potential                            delayed complications were discussed with the                            patient. Return to normal activities tomorrow.                            Written discharge instructions were provided to the                            patient.                           -  Resume previous diet.                           - Continue present medications.                           - Repeat colonoscopy is recommended for                            surveillance. The colonoscopy date will be                            determined after pathology results from today's                            exam become available for review. Kenney Peacemaker, MD 09/29/2023 2:04:46 PM This report has been signed electronically.

## 2023-09-29 NOTE — Patient Instructions (Addendum)
 There was a tiny polyp found and removed.   You also have a condition called diverticulosis - common and not usually a problem. Please read the handout provided.  Prep was good.  I will let you know pathology results and when to have another routine colonoscopy by mail and/or My Chart.  I appreciate the opportunity to care for you. Kenney Peacemaker, MD, FACG  YOU HAD AN ENDOSCOPIC PROCEDURE TODAY AT THE [facility]:   Refer to the procedure report that was given to you for any specific questions about what was found during the examination.  If the procedure report does not answer your questions, please call your gastroenterologist to clarify.  If you requested that your care partner not be given the details of your procedure findings, then the procedure report has been included in a sealed envelope for you to review at your convenience later.  YOU SHOULD EXPECT: Some feelings of bloating in the abdomen. Passage of more gas than usual.  Walking can help get rid of the air that was put into your GI tract during the procedure and reduce the bloating. If you had a lower endoscopy (such as a colonoscopy or flexible sigmoidoscopy) you may notice spotting of blood in your stool or on the toilet paper. If you underwent a bowel prep for your procedure, you may not have a normal bowel movement for a few days.  Please Note:  You might notice some irritation and congestion in your nose or some drainage.  This is from the oxygen used during your procedure.  There is no need for concern and it should clear up in a day or so.  SYMPTOMS TO REPORT IMMEDIATELY:  Following lower endoscopy (colonoscopy or flexible sigmoidoscopy):  Excessive amounts of blood in the stool  Significant tenderness or worsening of abdominal pains  Swelling of the abdomen that is new, acute  Fever of 100F or higher  For urgent or emergent issues, a gastroenterologist can be reached at any hour by calling [phone]  [phone]. Do not use MyChart messaging for urgent concerns.    DIET:  We do recommend a small meal at first, but then you may proceed to your regular diet.  Drink plenty of fluids but you should avoid alcoholic beverages for 24 hours.  ACTIVITY:  You should plan to take it easy for the rest of today and you should NOT DRIVE or use heavy machinery until tomorrow (because of the sedation medicines used during the test).    FOLLOW UP: Our staff will call the number listed on your records the next business day following your procedure.  We will call around 7:15- 8:00 am to check on you and address any questions or concerns that you may have regarding the information given to you following your procedure. If we do not reach you, we will leave a message.     If any biopsies were taken you will be contacted by phone or by letter within the next 1-3 weeks.  Please call us  at [phone] if you have not heard about the biopsies in 3 weeks.    SIGNATURES/CONFIDENTIALITY: You and/or your care partner have signed paperwork which will be entered into your electronic medical record.  These signatures attest to the fact that that the information above on your After Visit Summary has been reviewed and is understood.  Full responsibility of the confidentiality of this discharge information lies with you and/or your care-partner.

## 2023-09-30 ENCOUNTER — Telehealth: Payer: Self-pay

## 2023-09-30 NOTE — Telephone Encounter (Signed)
 No answer after follow up call. Voice message left.

## 2023-10-02 LAB — SURGICAL PATHOLOGY

## 2023-10-04 ENCOUNTER — Encounter: Payer: Self-pay | Admitting: Internal Medicine
